# Patient Record
Sex: Female | Born: 1945 | Race: White | Hispanic: No | Smoking: Current every day smoker
Health system: Southern US, Community
[De-identification: ages and names within clinical notes are randomized; demographics above are authoritative.]

---

## 2007-11-10 ENCOUNTER — Emergency Department (HOSPITAL_COMMUNITY): Admission: EM | Admit: 2007-11-10 | Discharge: 2007-11-11 | Payer: Self-pay | Admitting: Emergency Medicine

## 2012-12-27 ENCOUNTER — Ambulatory Visit: Payer: Self-pay | Admitting: Family Medicine

## 2013-05-08 ENCOUNTER — Ambulatory Visit: Payer: Self-pay | Admitting: Unknown Physician Specialty

## 2013-06-20 ENCOUNTER — Ambulatory Visit: Payer: Self-pay | Admitting: Unknown Physician Specialty

## 2013-07-17 ENCOUNTER — Ambulatory Visit: Payer: Self-pay | Admitting: Internal Medicine

## 2013-07-27 ENCOUNTER — Ambulatory Visit: Payer: Self-pay | Admitting: Internal Medicine

## 2013-07-28 LAB — FOLATE: Folic Acid: 26.2 ng/mL (ref 3.1–100.0)

## 2013-07-28 LAB — CBC CANCER CENTER
Basophil #: 0.1 x10 3/mm (ref 0.0–0.1)
Basophil %: 0.4 %
HCT: 34.8 % — ABNORMAL LOW (ref 35.0–47.0)
HGB: 11.9 g/dL — ABNORMAL LOW (ref 12.0–16.0)
Lymphocyte %: 32.8 %
MCV: 87 fL (ref 80–100)
Monocyte #: 0.7 x10 3/mm (ref 0.2–0.9)
Neutrophil #: 8.8 x10 3/mm — ABNORMAL HIGH (ref 1.4–6.5)
Neutrophil %: 61.4 %
Platelet: 521 x10 3/mm — ABNORMAL HIGH (ref 150–440)

## 2013-07-28 LAB — IRON AND TIBC
Iron Saturation: 21 %
Iron: 91 ug/dL (ref 50–170)

## 2013-07-28 LAB — RETICULOCYTES: Reticulocyte: 1.77 %

## 2013-07-30 IMAGING — CT CT ABDOMEN W/O CM
1 of 2 series · 15 of 32 positions shown, 19 images · non-contrast
Comparison: None

REASON FOR EXAM: Abdominal US showed an absent spleen Please confirm
COMMENTS:

PROCEDURE:     KCT - KCT ABDOMEN STANDARD WO  - June 20, 2013  [DATE]
RESULT:     History: Abdominal ultrasound
TECHNIQUE: Multiple axial images of the abdomen were performed from the lung
bases to the iliac crests, without p.o. contrast and without intravenous
contrast.

[Series 2: abd 3mm wo 3.0 i40f 3 · axial · 0.70mm/px · z∈[-721,-511]mm · 15 of 78 slices shown, 19 images]
[im 4/78  soft-tissue]
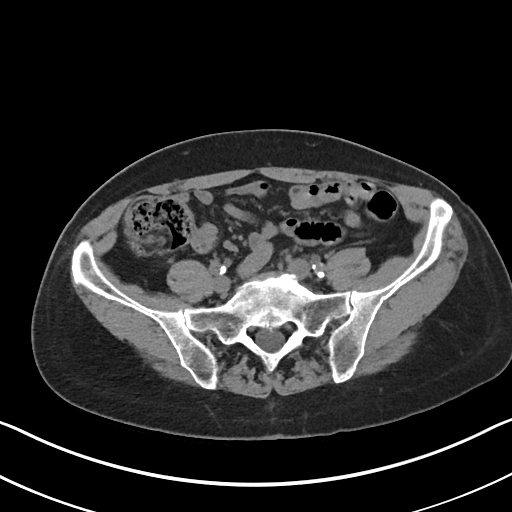
[im 4/78  bone]
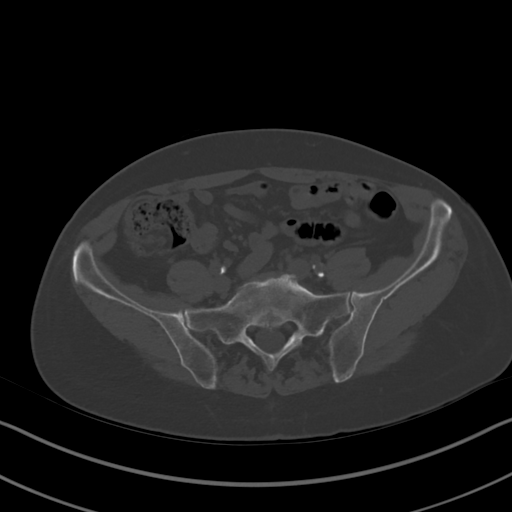
[im 11/78  soft-tissue]
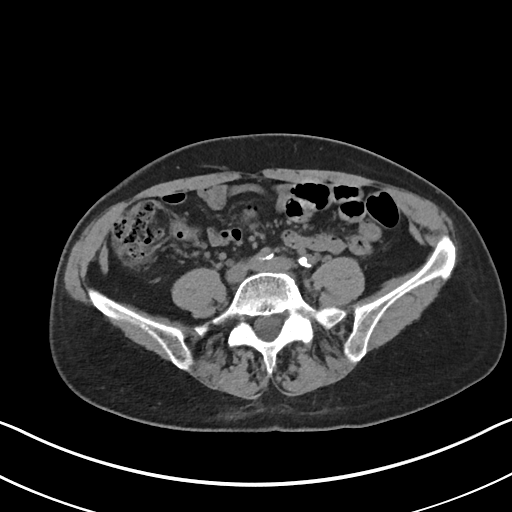
[im 17/78  soft-tissue]
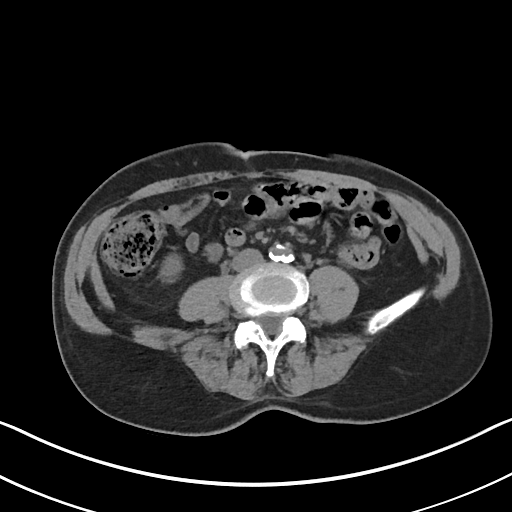
[im 21/78  soft-tissue]
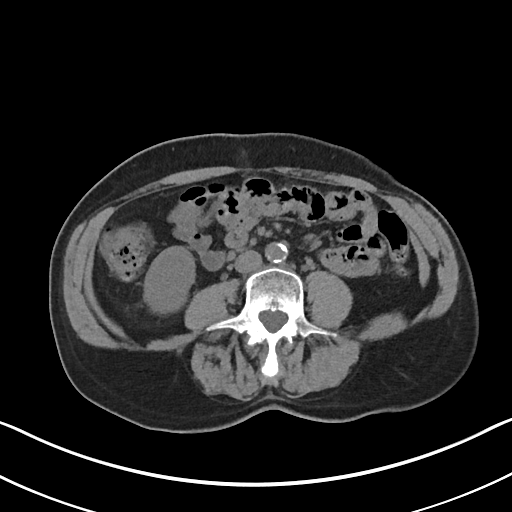
[im 27/78  soft-tissue]
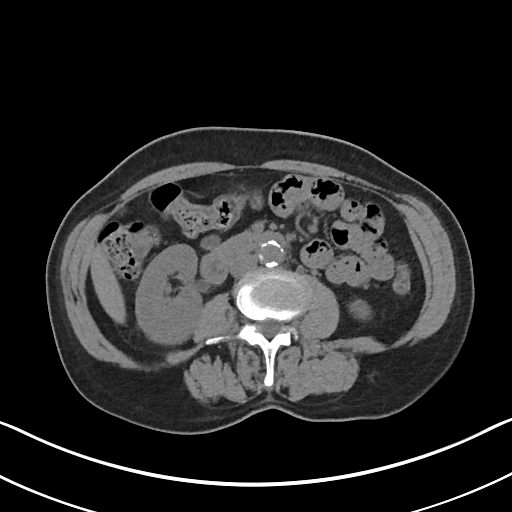
[im 34/78  soft-tissue]
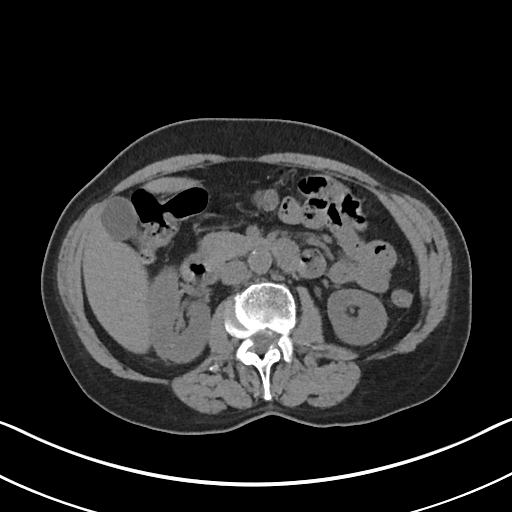
[im 41/78  soft-tissue]
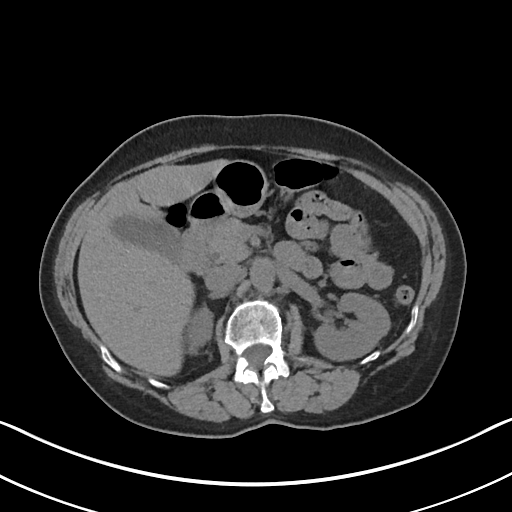
[im 44/78  soft-tissue]
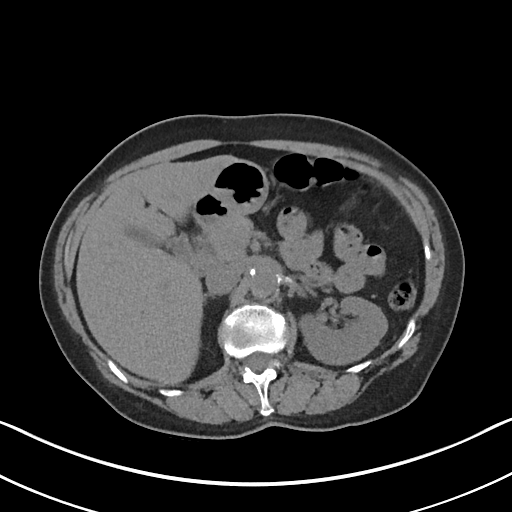
[im 51/78  soft-tissue]
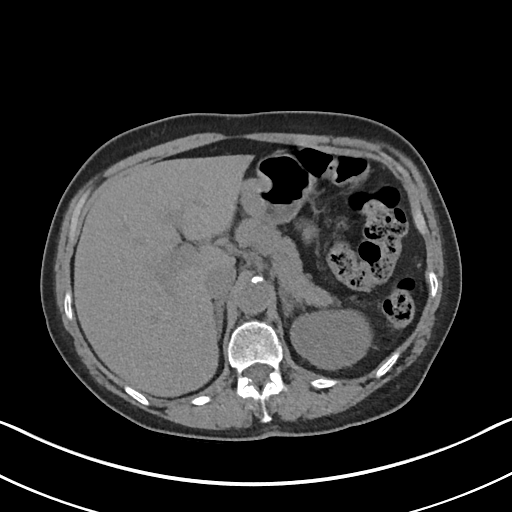
[im 51/78  bone]
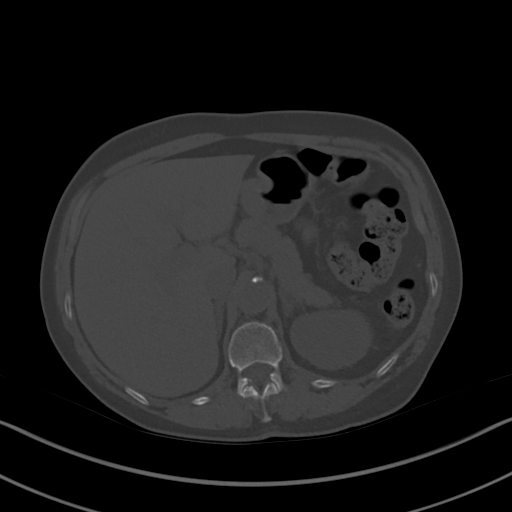
[im 57/78  soft-tissue]
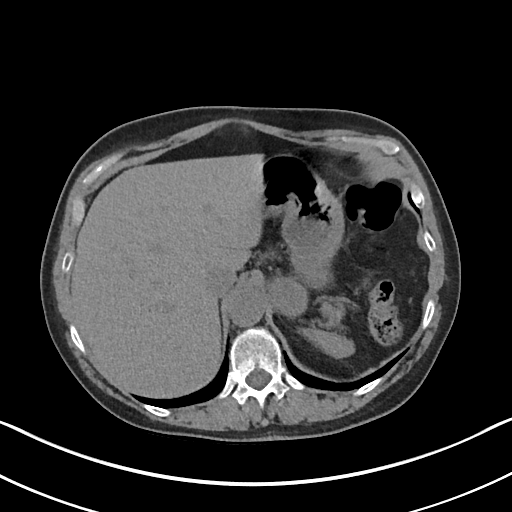
[im 61/78  soft-tissue]
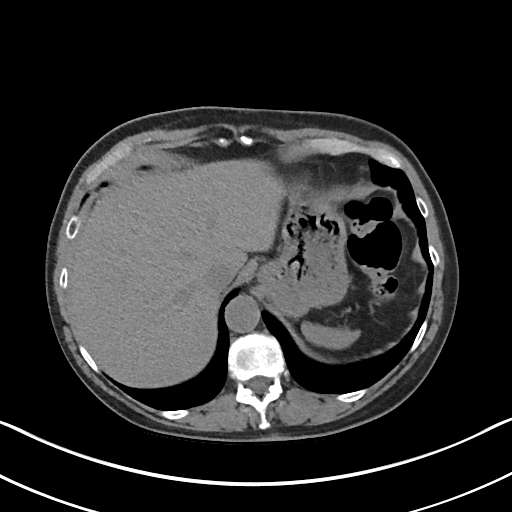
[im 64/78  lung]
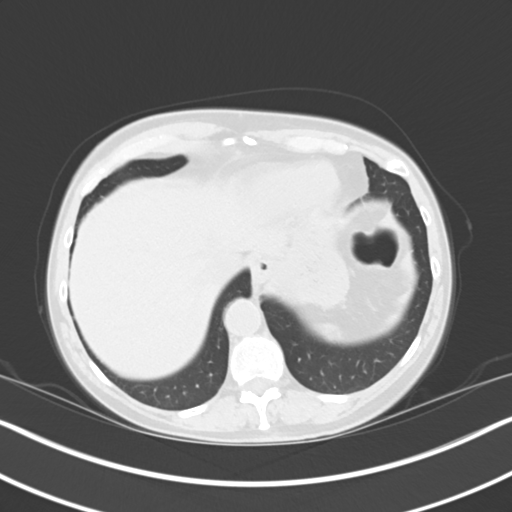
[im 67/78  soft-tissue]
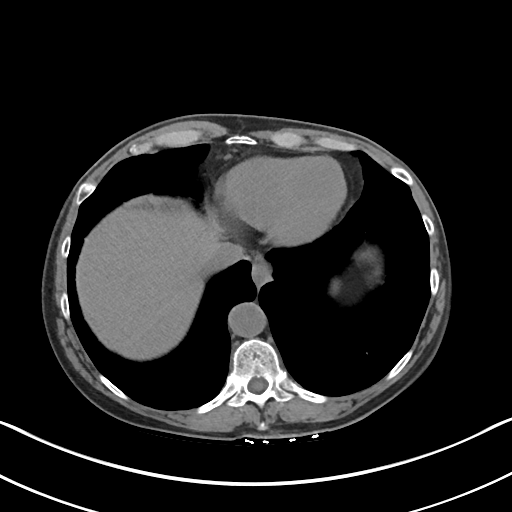
[im 67/78  lung]
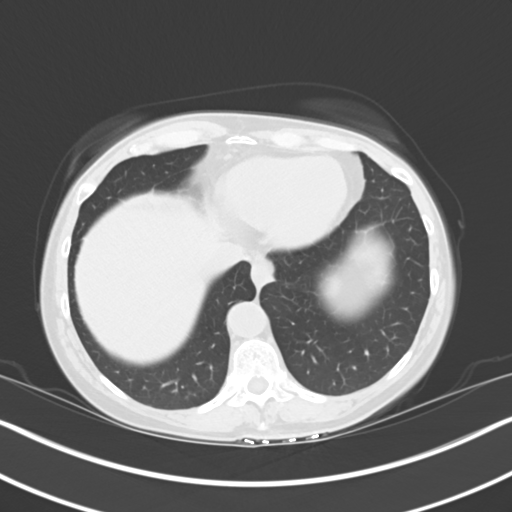
[im 71/78  lung]
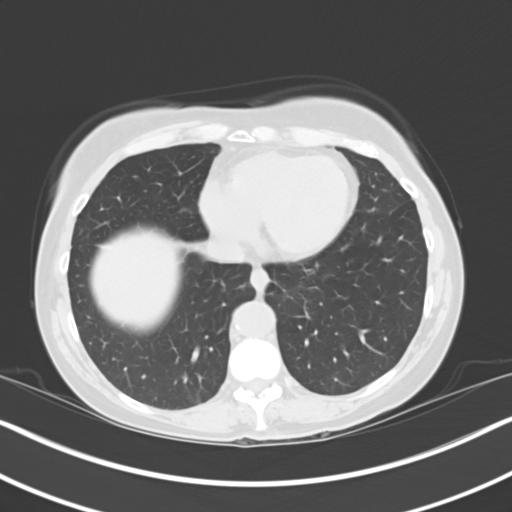
[im 74/78  soft-tissue]
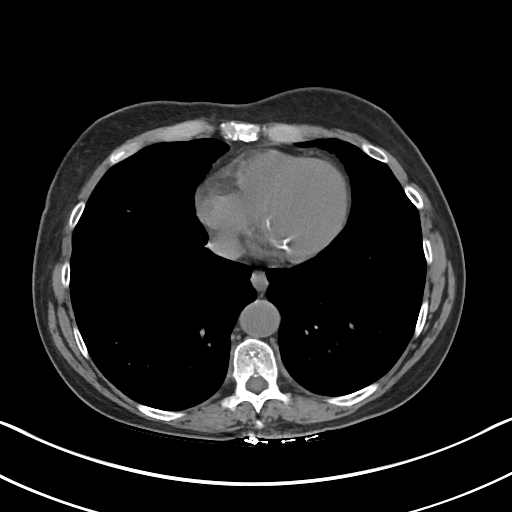
[im 74/78  lung]
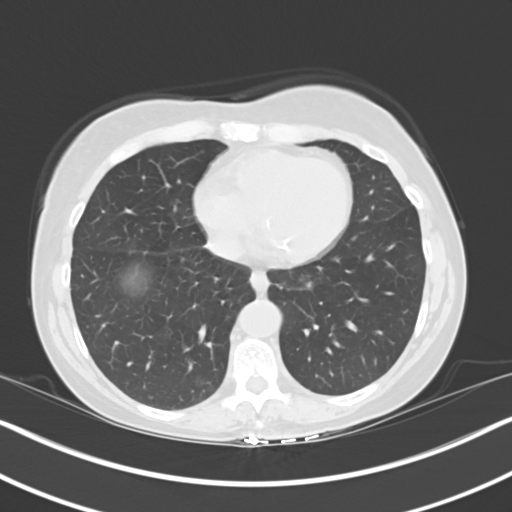

[15 of 32 positions shown; findings below may reference images not displayed]

FINDINGS: The lung bases are clear. There is no pneumothorax. The heart size is
normal.

The liver demonstrates no focal abnormality. There is no intrahepatic or
extrahepatic biliary ductal dilatation. The gallbladder is unremarkable. The
spleen is small in size without focal abnormality. The spleen measures 4 cm
in length.. The kidneys, adrenal glands, and pancreas are normal.

The visualized portions of the stomach, duodenum, small intestine, and large
intestine demonstrate no contrast extravasation or dilatation. There is no
pneumoperitoneum, pneumatosis, or portal venous gas. There is no abdominal
free fluid. There is no lymphadenopathy.

The abdominal aorta is normal in caliber with atherosclerosis.

The osseous structures are unremarkable.
IMPRESSION: 1. Spleen is small in size without focal abnormality.

[REDACTED]

## 2013-08-07 ENCOUNTER — Ambulatory Visit: Payer: Self-pay | Admitting: Internal Medicine

## 2013-09-06 ENCOUNTER — Ambulatory Visit: Payer: Self-pay | Admitting: Internal Medicine

## 2013-10-30 ENCOUNTER — Ambulatory Visit: Payer: Self-pay | Admitting: Unknown Physician Specialty

## 2013-11-03 LAB — PATHOLOGY REPORT

## 2013-12-11 ENCOUNTER — Ambulatory Visit: Payer: Self-pay | Admitting: Internal Medicine

## 2013-12-11 LAB — CBC CANCER CENTER
BASOS ABS: 0.1 x10 3/mm (ref 0.0–0.1)
BASOS PCT: 0.5 %
EOS ABS: 0 x10 3/mm (ref 0.0–0.7)
EOS PCT: 0.2 %
HCT: 39.8 % (ref 35.0–47.0)
HGB: 13 g/dL (ref 12.0–16.0)
Lymphocyte #: 5.7 x10 3/mm — ABNORMAL HIGH (ref 1.0–3.6)
Lymphocyte %: 28.1 %
MCH: 30 pg (ref 26.0–34.0)
MCHC: 32.7 g/dL (ref 32.0–36.0)
MCV: 92 fL (ref 80–100)
MONO ABS: 0.7 x10 3/mm (ref 0.2–0.9)
Monocyte %: 3.6 %
NEUTROS ABS: 13.6 x10 3/mm — AB (ref 1.4–6.5)
Neutrophil %: 67.6 %
Platelet: 463 x10 3/mm — ABNORMAL HIGH (ref 150–440)
RBC: 4.33 10*6/uL (ref 3.80–5.20)
RDW: 14.8 % — AB (ref 11.5–14.5)
WBC: 20.1 x10 3/mm — AB (ref 3.6–11.0)

## 2013-12-11 LAB — IRON AND TIBC
IRON BIND. CAP.(TOTAL): 339 ug/dL (ref 250–450)
IRON: 107 ug/dL (ref 50–170)
Iron Saturation: 32 %
Unbound Iron-Bind.Cap.: 232 ug/dL

## 2013-12-11 LAB — FERRITIN: Ferritin (ARMC): 48 ng/mL (ref 8–388)

## 2014-01-07 ENCOUNTER — Ambulatory Visit: Payer: Self-pay | Admitting: Internal Medicine

## 2014-03-12 DIAGNOSIS — M1990 Unspecified osteoarthritis, unspecified site: Secondary | ICD-10-CM | POA: Diagnosis present

## 2014-04-10 ENCOUNTER — Ambulatory Visit: Payer: Self-pay | Admitting: Internal Medicine

## 2014-04-10 LAB — CBC CANCER CENTER
BASOS PCT: 0.7 %
Basophil #: 0.1 x10 3/mm (ref 0.0–0.1)
EOS ABS: 0.2 x10 3/mm (ref 0.0–0.7)
Eosinophil %: 1 %
HCT: 38.3 % (ref 35.0–47.0)
HGB: 12.8 g/dL (ref 12.0–16.0)
Lymphocyte #: 4.9 x10 3/mm — ABNORMAL HIGH (ref 1.0–3.6)
Lymphocyte %: 32.3 %
MCH: 30.6 pg (ref 26.0–34.0)
MCHC: 33.5 g/dL (ref 32.0–36.0)
MCV: 91 fL (ref 80–100)
MONOS PCT: 3.8 %
Monocyte #: 0.6 x10 3/mm (ref 0.2–0.9)
NEUTROS ABS: 9.5 x10 3/mm — AB (ref 1.4–6.5)
Neutrophil %: 62.2 %
Platelet: 495 x10 3/mm — ABNORMAL HIGH (ref 150–440)
RBC: 4.2 10*6/uL (ref 3.80–5.20)
RDW: 14.5 % (ref 11.5–14.5)
WBC: 15.2 x10 3/mm — ABNORMAL HIGH (ref 3.6–11.0)

## 2014-04-10 LAB — IRON AND TIBC
IRON BIND. CAP.(TOTAL): 325 ug/dL (ref 250–450)
IRON: 81 ug/dL (ref 50–170)
Iron Saturation: 25 %
UNBOUND IRON-BIND. CAP.: 244 ug/dL

## 2014-04-10 LAB — FERRITIN: Ferritin (ARMC): 62 ng/mL (ref 8–388)

## 2014-05-07 ENCOUNTER — Ambulatory Visit: Payer: Self-pay | Admitting: Internal Medicine

## 2014-05-22 ENCOUNTER — Ambulatory Visit: Payer: Self-pay | Admitting: Family Medicine

## 2014-07-01 IMAGING — MG MM DIGITAL SCREENING BILAT W/ CAD
1 series · 4 of 4 positions shown · non-contrast
Comparison: Previous exam(s).

CLINICAL DATA: Screening.

EXAM:
DIGITAL SCREENING BILATERAL MAMMOGRAM WITH CAD

[R CC · right · 4 of 4 slices shown]
[im 1/4]
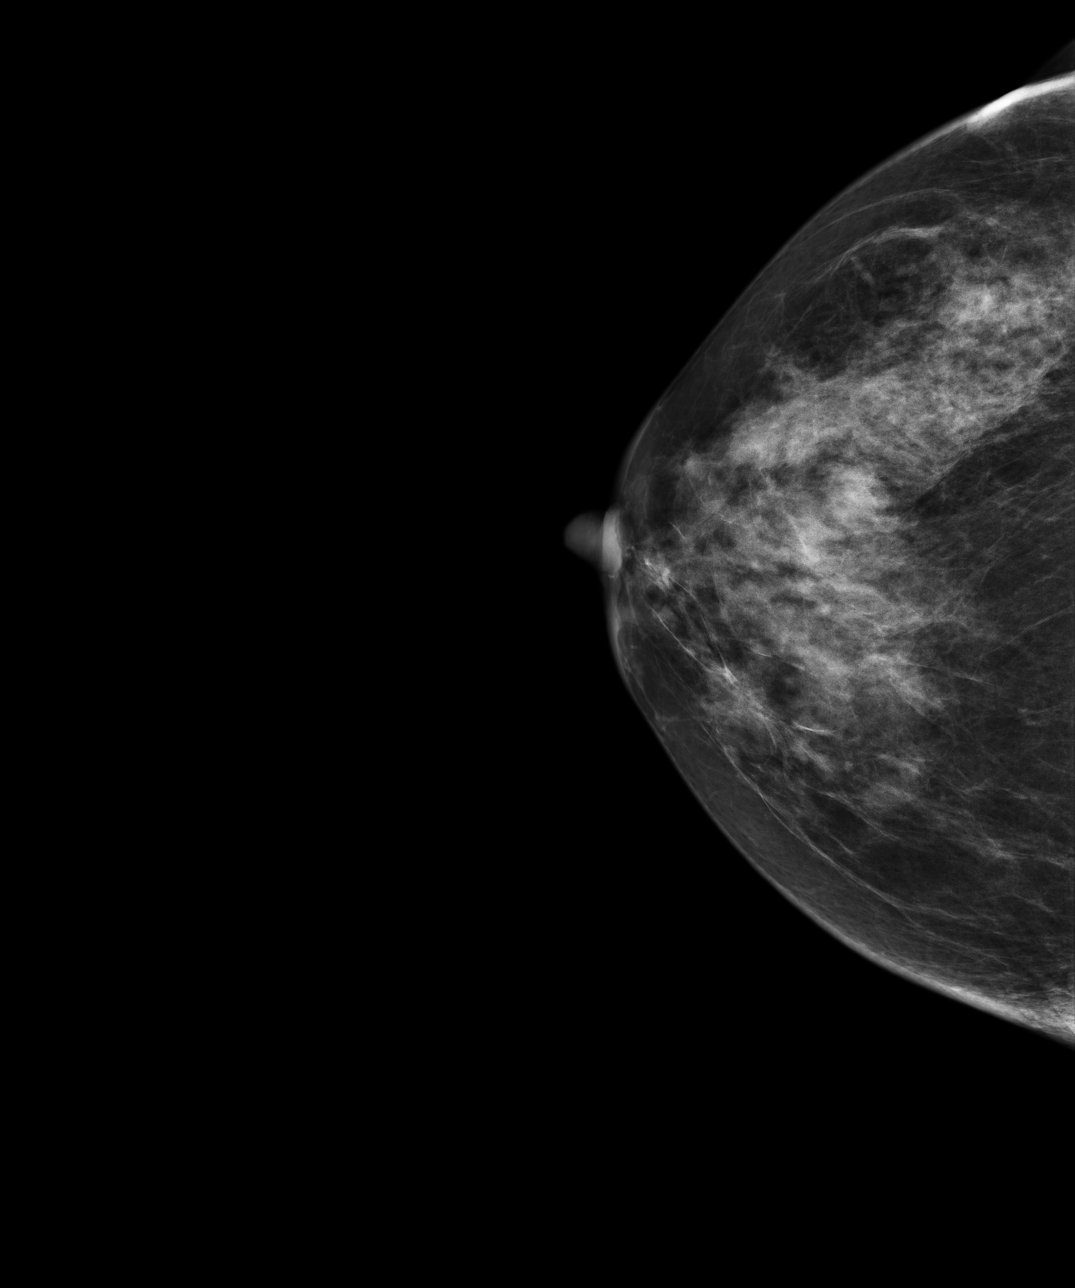
[im 2/4]
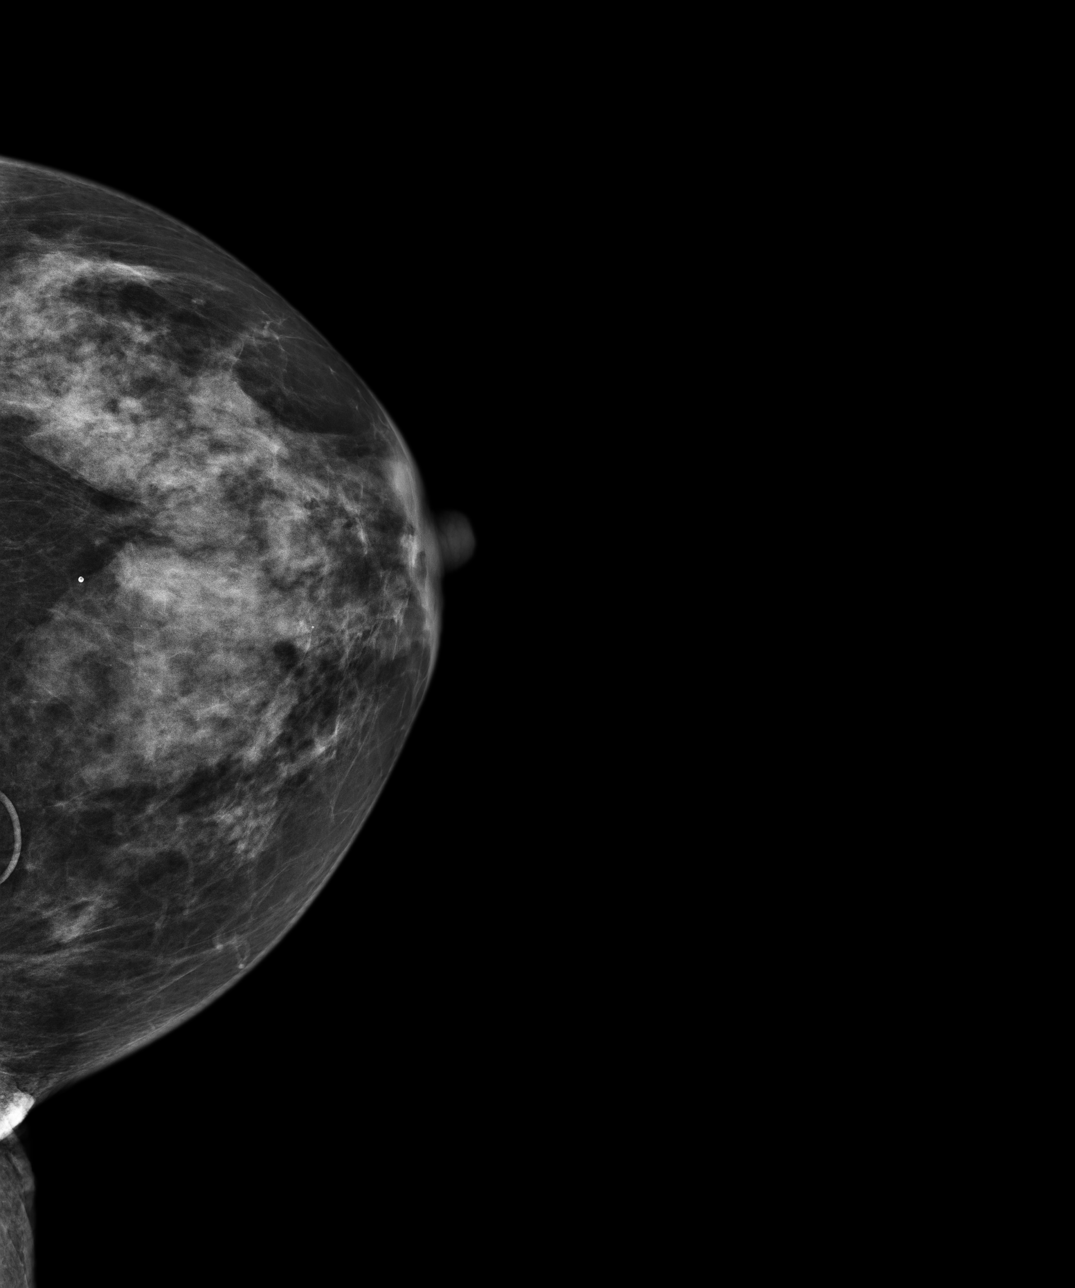
[im 3/4]
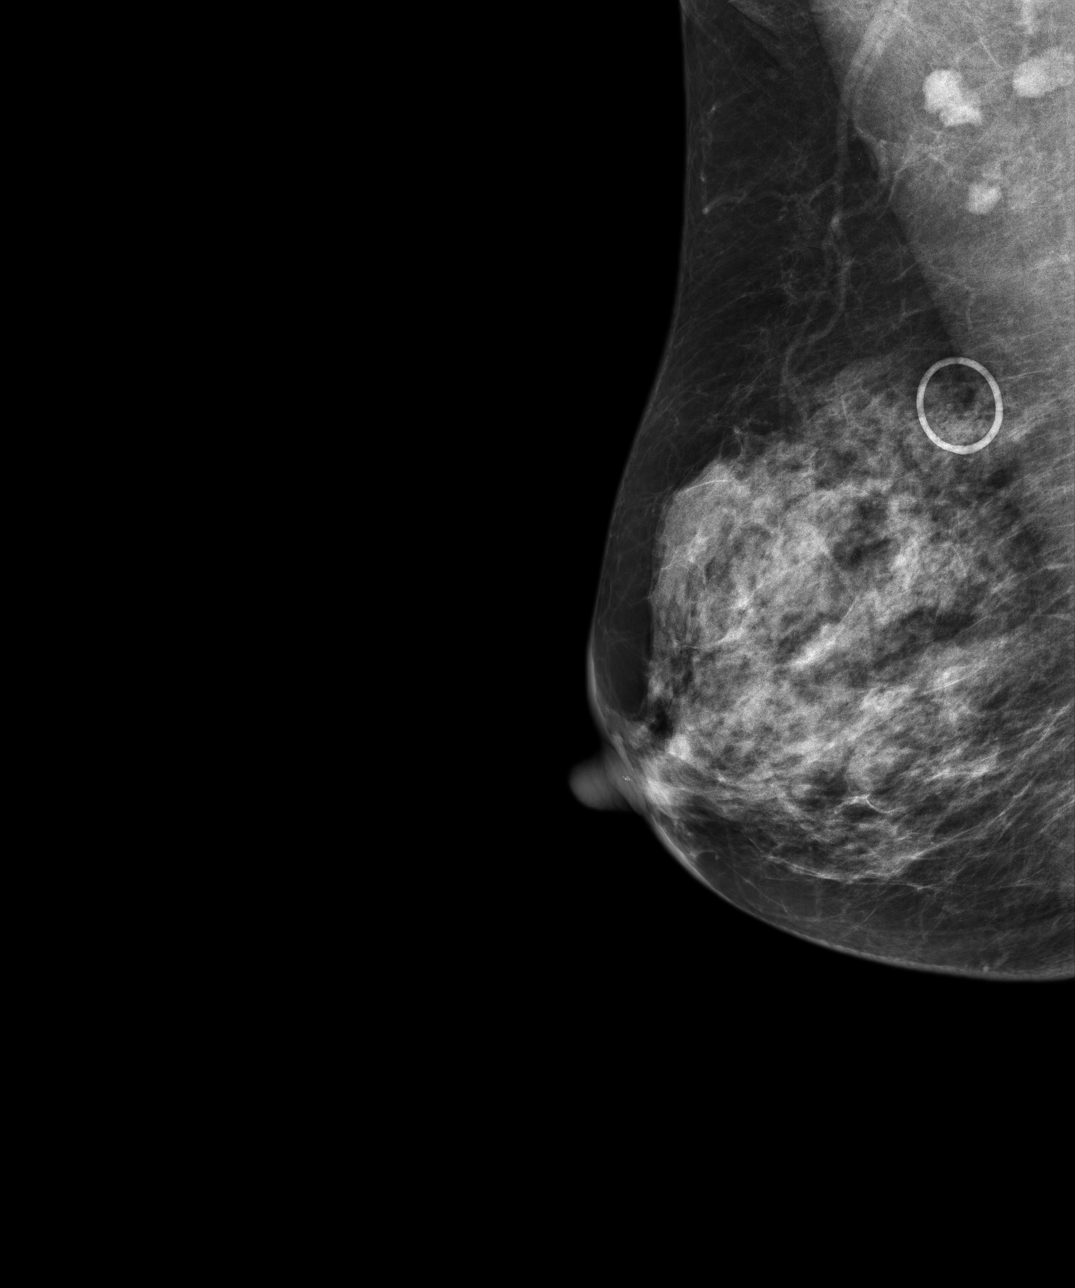
[im 4/4]
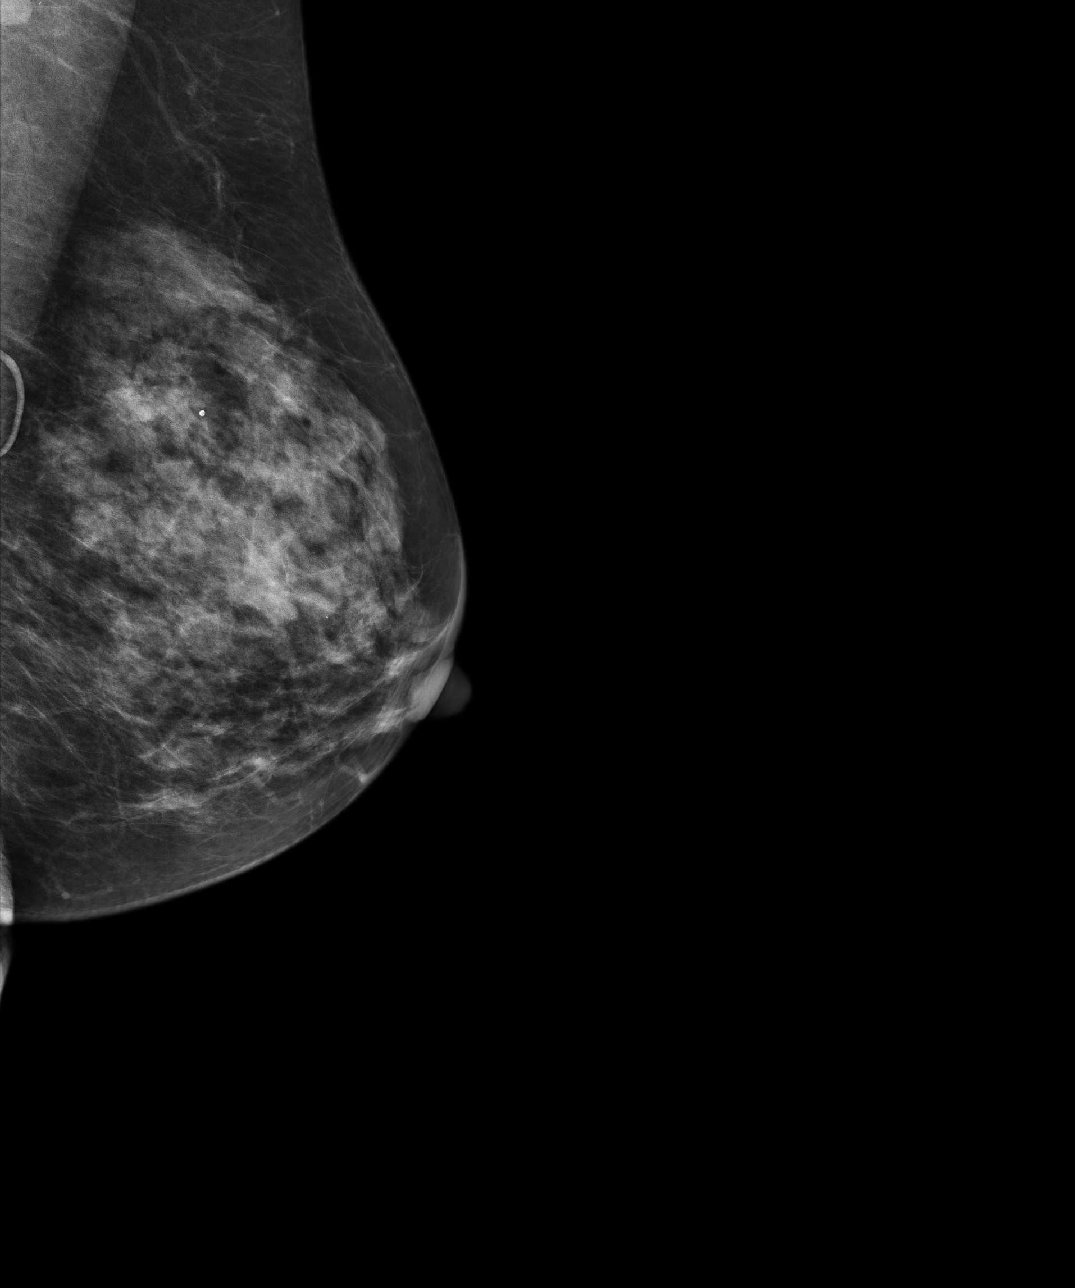

[4 of 4 positions shown; findings below may reference images not displayed]

ACR Breast Density Category d: The breast tissue is extremely dense,
which lowers the sensitivity of mammography.
FINDINGS: There are no findings suspicious for malignancy. Images were
processed with CAD.
IMPRESSION: No mammographic evidence of malignancy. A result letter of this
screening mammogram will be mailed directly to the patient.

RECOMMENDATION:
Screening mammogram in one year. (Code:BD-D-K0F)

BI-RADS CATEGORY  1: Negative.

## 2014-08-14 ENCOUNTER — Ambulatory Visit: Payer: Self-pay | Admitting: Internal Medicine

## 2014-08-14 LAB — CBC CANCER CENTER
Basophil #: 0.1 x10 3/mm (ref 0.0–0.1)
Basophil %: 0.4 %
EOS PCT: 1.1 %
Eosinophil #: 0.2 x10 3/mm (ref 0.0–0.7)
HCT: 38.5 % (ref 35.0–47.0)
HGB: 12.8 g/dL (ref 12.0–16.0)
LYMPHS PCT: 31.7 %
Lymphocyte #: 4.8 x10 3/mm — ABNORMAL HIGH (ref 1.0–3.6)
MCH: 30.6 pg (ref 26.0–34.0)
MCHC: 33.3 g/dL (ref 32.0–36.0)
MCV: 92 fL (ref 80–100)
MONOS PCT: 4.3 %
Monocyte #: 0.6 x10 3/mm (ref 0.2–0.9)
Neutrophil #: 9.4 x10 3/mm — ABNORMAL HIGH (ref 1.4–6.5)
Neutrophil %: 62.5 %
PLATELETS: 435 x10 3/mm (ref 150–440)
RBC: 4.18 10*6/uL (ref 3.80–5.20)
RDW: 14.3 % (ref 11.5–14.5)
WBC: 15.1 x10 3/mm — ABNORMAL HIGH (ref 3.6–11.0)

## 2014-08-14 LAB — IRON AND TIBC
IRON BIND. CAP.(TOTAL): 342 ug/dL (ref 250–450)
IRON SATURATION: 22 %
Iron: 75 ug/dL (ref 50–170)
Unbound Iron-Bind.Cap.: 267 ug/dL

## 2014-08-14 LAB — FERRITIN: Ferritin (ARMC): 37 ng/mL (ref 8–388)

## 2014-09-06 ENCOUNTER — Ambulatory Visit: Payer: Self-pay | Admitting: Internal Medicine

## 2014-11-12 ENCOUNTER — Ambulatory Visit: Payer: Self-pay | Admitting: Unknown Physician Specialty

## 2015-05-10 DIAGNOSIS — M810 Age-related osteoporosis without current pathological fracture: Secondary | ICD-10-CM | POA: Diagnosis present

## 2016-05-14 ENCOUNTER — Other Ambulatory Visit: Payer: Self-pay | Admitting: Family Medicine

## 2016-05-14 DIAGNOSIS — Z1239 Encounter for other screening for malignant neoplasm of breast: Secondary | ICD-10-CM

## 2016-06-02 ENCOUNTER — Other Ambulatory Visit: Payer: Self-pay | Admitting: Family Medicine

## 2016-06-02 ENCOUNTER — Ambulatory Visit
Admission: RE | Admit: 2016-06-02 | Discharge: 2016-06-02 | Disposition: A | Discharge status: Home / Self Care | Payer: Medicare Other | Source: Ambulatory Visit | Attending: Family Medicine | Admitting: Family Medicine

## 2016-06-02 DIAGNOSIS — Z1239 Encounter for other screening for malignant neoplasm of breast: Secondary | ICD-10-CM

## 2016-06-02 DIAGNOSIS — Z1231 Encounter for screening mammogram for malignant neoplasm of breast: Secondary | ICD-10-CM

## 2017-05-18 ENCOUNTER — Other Ambulatory Visit: Payer: Self-pay | Admitting: Family Medicine

## 2017-05-18 DIAGNOSIS — Z1239 Encounter for other screening for malignant neoplasm of breast: Secondary | ICD-10-CM

## 2017-06-07 ENCOUNTER — Ambulatory Visit
Admission: RE | Admit: 2017-06-07 | Discharge: 2017-06-07 | Disposition: A | Discharge status: Home / Self Care | Payer: Medicare Other | Source: Ambulatory Visit | Attending: Family Medicine | Admitting: Family Medicine

## 2017-06-07 DIAGNOSIS — Z1231 Encounter for screening mammogram for malignant neoplasm of breast: Secondary | ICD-10-CM | POA: Diagnosis not present

## 2017-06-07 DIAGNOSIS — Z1239 Encounter for other screening for malignant neoplasm of breast: Secondary | ICD-10-CM

## 2018-05-18 DIAGNOSIS — E785 Hyperlipidemia, unspecified: Secondary | ICD-10-CM | POA: Diagnosis present

## 2019-09-11 ENCOUNTER — Telehealth: Payer: Self-pay | Admitting: *Deleted

## 2019-09-11 NOTE — Telephone Encounter (Signed)
Received referral for low dose lung cancer screening CT scan. Message left at phone number listed in EMR for patient to call me back to facilitate scheduling scan.  

## 2019-09-12 ENCOUNTER — Telehealth: Payer: Self-pay | Admitting: *Deleted

## 2019-09-12 DIAGNOSIS — Z87891 Personal history of nicotine dependence: Secondary | ICD-10-CM

## 2019-09-12 DIAGNOSIS — Z122 Encounter for screening for malignant neoplasm of respiratory organs: Secondary | ICD-10-CM

## 2019-09-12 NOTE — Telephone Encounter (Signed)
Received referral for initial lung cancer screening scan. Contacted patient and obtained smoking history,(current, 32.25 pack year) as well as answering questions related to screening process. Patient denies signs of lung cancer such as weight loss or hemoptysis. Patient denies comorbidity that would prevent curative treatment if lung cancer were found. Patient is scheduled for shared decision making visit and CT scan on 09/28/19 at 115pm.

## 2019-09-27 ENCOUNTER — Encounter: Payer: Self-pay | Admitting: Nurse Practitioner

## 2019-09-28 ENCOUNTER — Inpatient Hospital Stay: Payer: Medicare Other | Attending: Nurse Practitioner | Admitting: Nurse Practitioner

## 2019-09-28 ENCOUNTER — Other Ambulatory Visit: Payer: Self-pay

## 2019-09-28 ENCOUNTER — Ambulatory Visit
Admission: RE | Admit: 2019-09-28 | Discharge: 2019-09-28 | Disposition: A | Discharge status: Home / Self Care | Payer: Medicare Other | Source: Ambulatory Visit | Attending: Nurse Practitioner | Admitting: Nurse Practitioner

## 2019-09-28 DIAGNOSIS — Z122 Encounter for screening for malignant neoplasm of respiratory organs: Secondary | ICD-10-CM

## 2019-09-28 DIAGNOSIS — Z87891 Personal history of nicotine dependence: Secondary | ICD-10-CM | POA: Diagnosis present

## 2019-09-28 DIAGNOSIS — F17210 Nicotine dependence, cigarettes, uncomplicated: Secondary | ICD-10-CM | POA: Diagnosis not present

## 2019-09-28 NOTE — Progress Notes (Signed)
Virtual Visit via Video Enabled Telemedicine Note   I connected with Marilyn Nicholson on 09/28/19 at 1:15 PM EST by video enabled telemedicine visit and verified that I am speaking with the correct person using two identifiers.   I discussed the limitations, risks, security and privacy concerns of performing an evaluation and management service by telemedicine and the availability of in-person appointments. I also discussed with the patient that there may be a patient responsible charge related to this service. The patient expressed understanding and agreed to proceed.   Other persons participating in the visit and their role in the encounter: Burgess Estelle, RN- checking in patient & navigation  Patient's location: [place]  Provider's location: Clinic  Chief Complaint: Low Dose CT Screening  Patient agreed to evaluation by telemedicine to discuss shared decision making for consideration of low dose CT lung cancer screening.    In accordance with CMS guidelines, patient has met eligibility criteria including age, absence of signs or symptoms of lung cancer.  Social History   Tobacco Use  . Smoking status: Current Every Day Smoker    Packs/day: 0.75    Years: 43.00    Pack years: 32.25    Types: Cigarettes  Substance Use Topics  . Alcohol use: Not on file     A shared decision-making session was conducted prior to the performance of CT scan. This includes one or more decision aids, includes benefits and harms of screening, follow-up diagnostic testing, over-diagnosis, false positive rate, and total radiation exposure.   Counseling on the importance of adherence to annual lung cancer LDCT screening, impact of co-morbidities, and ability or willingness to undergo diagnosis and treatment is imperative for compliance of the program.   Counseling on the importance of continued smoking cessation for former smokers; the importance of smoking cessation for current smokers, and information  about tobacco cessation interventions have been given to patient including [place] and [employer] [place] programs.   Written order for lung cancer screening with LDCT has been given to the patient and any and all questions have been answered to the best of my abilities.    Yearly follow up will be coordinated by Burgess Estelle, Thoracic Navigator.  I discussed the assessment and treatment plan with the patient. The patient was provided an opportunity to ask questions and all were answered. The patient agreed with the plan and demonstrated an understanding of the instructions.   The patient was advised to call back or seek an in-person evaluation if the symptoms worsen or if the condition fails to improve as anticipated.   I provided 15 minutes of face-to-face video visit time during this encounter, and > 50% was spent counseling as documented under my assessment & plan.   Beckey Rutter, DNP, AGNP-C [place] at [facility] [phone] (work cell) [phone] (office)

## 2019-10-05 ENCOUNTER — Encounter: Payer: Self-pay | Admitting: *Deleted

## 2019-10-31 ENCOUNTER — Other Ambulatory Visit: Payer: Self-pay | Admitting: Family Medicine

## 2020-04-11 ENCOUNTER — Other Ambulatory Visit: Payer: Self-pay | Admitting: Family Medicine

## 2020-04-11 DIAGNOSIS — Z1231 Encounter for screening mammogram for malignant neoplasm of breast: Secondary | ICD-10-CM

## 2020-04-16 ENCOUNTER — Ambulatory Visit
Admission: RE | Admit: 2020-04-16 | Discharge: 2020-04-16 | Disposition: A | Discharge status: Home / Self Care | Payer: Medicare Other | Source: Ambulatory Visit | Attending: Family Medicine | Admitting: Family Medicine

## 2020-04-16 DIAGNOSIS — Z1231 Encounter for screening mammogram for malignant neoplasm of breast: Secondary | ICD-10-CM | POA: Insufficient documentation

## 2020-09-27 ENCOUNTER — Telehealth: Payer: Self-pay

## 2020-09-27 NOTE — Telephone Encounter (Signed)
Attempted to contact patient to schedule lung screening scan but unable to reach her. Left message to return call back to schedule CT scan.

## 2020-10-10 ENCOUNTER — Other Ambulatory Visit: Payer: Self-pay | Admitting: *Deleted

## 2020-10-10 DIAGNOSIS — Z87891 Personal history of nicotine dependence: Secondary | ICD-10-CM

## 2020-10-10 DIAGNOSIS — Z122 Encounter for screening for malignant neoplasm of respiratory organs: Secondary | ICD-10-CM

## 2020-10-10 NOTE — Progress Notes (Signed)
Contacted and scheduled for lung screening scan. Current smoker, 33 pack year

## 2020-10-15 ENCOUNTER — Ambulatory Visit
Admission: RE | Admit: 2020-10-15 | Discharge: 2020-10-15 | Disposition: A | Discharge status: Home / Self Care | Payer: Medicare Other | Source: Ambulatory Visit | Attending: Nurse Practitioner | Admitting: Nurse Practitioner

## 2020-10-15 ENCOUNTER — Other Ambulatory Visit: Payer: Self-pay

## 2020-10-15 DIAGNOSIS — Z122 Encounter for screening for malignant neoplasm of respiratory organs: Secondary | ICD-10-CM | POA: Diagnosis present

## 2020-10-15 DIAGNOSIS — Z87891 Personal history of nicotine dependence: Secondary | ICD-10-CM | POA: Diagnosis present

## 2020-10-17 ENCOUNTER — Encounter: Payer: Self-pay | Admitting: *Deleted

## 2021-11-30 ENCOUNTER — Other Ambulatory Visit: Payer: Self-pay

## 2021-11-30 ENCOUNTER — Emergency Department (HOSPITAL_COMMUNITY): Payer: Medicare Other

## 2021-11-30 ENCOUNTER — Inpatient Hospital Stay (HOSPITAL_COMMUNITY)
Admission: EM | Admit: 2021-11-30 | Discharge: 2021-12-05 | DRG: 481 | Disposition: A | Discharge status: Home Health | Payer: Medicare Other | Attending: Family Medicine | Admitting: Family Medicine

## 2021-11-30 ENCOUNTER — Encounter (HOSPITAL_COMMUNITY): Payer: Self-pay | Admitting: Emergency Medicine

## 2021-11-30 DIAGNOSIS — Z96649 Presence of unspecified artificial hip joint: Secondary | ICD-10-CM

## 2021-11-30 DIAGNOSIS — D62 Acute posthemorrhagic anemia: Secondary | ICD-10-CM | POA: Diagnosis not present

## 2021-11-30 DIAGNOSIS — W010XXA Fall on same level from slipping, tripping and stumbling without subsequent striking against object, initial encounter: Secondary | ICD-10-CM | POA: Diagnosis present

## 2021-11-30 DIAGNOSIS — R110 Nausea: Secondary | ICD-10-CM | POA: Diagnosis not present

## 2021-11-30 DIAGNOSIS — M1990 Unspecified osteoarthritis, unspecified site: Secondary | ICD-10-CM | POA: Diagnosis present

## 2021-11-30 DIAGNOSIS — I10 Essential (primary) hypertension: Secondary | ICD-10-CM | POA: Diagnosis present

## 2021-11-30 DIAGNOSIS — Z419 Encounter for procedure for purposes other than remedying health state, unspecified: Secondary | ICD-10-CM | POA: Diagnosis present

## 2021-11-30 DIAGNOSIS — S51012A Laceration without foreign body of left elbow, initial encounter: Secondary | ICD-10-CM | POA: Diagnosis present

## 2021-11-30 DIAGNOSIS — E871 Hypo-osmolality and hyponatremia: Secondary | ICD-10-CM | POA: Diagnosis present

## 2021-11-30 DIAGNOSIS — K08409 Partial loss of teeth, unspecified cause, unspecified class: Secondary | ICD-10-CM | POA: Diagnosis present

## 2021-11-30 DIAGNOSIS — Z20822 Contact with and (suspected) exposure to covid-19: Secondary | ICD-10-CM | POA: Diagnosis present

## 2021-11-30 DIAGNOSIS — D72829 Elevated white blood cell count, unspecified: Secondary | ICD-10-CM | POA: Diagnosis present

## 2021-11-30 DIAGNOSIS — Z79899 Other long term (current) drug therapy: Secondary | ICD-10-CM

## 2021-11-30 DIAGNOSIS — D649 Anemia, unspecified: Secondary | ICD-10-CM | POA: Diagnosis present

## 2021-11-30 DIAGNOSIS — Z888 Allergy status to other drugs, medicaments and biological substances status: Secondary | ICD-10-CM | POA: Diagnosis not present

## 2021-11-30 DIAGNOSIS — R54 Age-related physical debility: Secondary | ICD-10-CM | POA: Diagnosis present

## 2021-11-30 DIAGNOSIS — M810 Age-related osteoporosis without current pathological fracture: Secondary | ICD-10-CM | POA: Diagnosis present

## 2021-11-30 DIAGNOSIS — E785 Hyperlipidemia, unspecified: Secondary | ICD-10-CM | POA: Diagnosis present

## 2021-11-30 DIAGNOSIS — F17210 Nicotine dependence, cigarettes, uncomplicated: Secondary | ICD-10-CM | POA: Diagnosis present

## 2021-11-30 DIAGNOSIS — S72142A Displaced intertrochanteric fracture of left femur, initial encounter for closed fracture: Principal | ICD-10-CM | POA: Diagnosis present

## 2021-11-30 LAB — BASIC METABOLIC PANEL
Anion gap: 10 (ref 5–15)
BUN: 18 mg/dL (ref 8–23)
CO2: 18 mmol/L — ABNORMAL LOW (ref 22–32)
Calcium: 8.9 mg/dL (ref 8.9–10.3)
Chloride: 105 mmol/L (ref 98–111)
Creatinine, Ser: 0.86 mg/dL (ref 0.44–1.00)
GFR, Estimated: >60 mL/min (ref >60)
Glucose, Bld: 91 mg/dL (ref 70–99)
Potassium: 3.7 mmol/L (ref 3.5–5.1)
Sodium: 133 mmol/L — ABNORMAL LOW (ref 135–145)

## 2021-11-30 LAB — CBC WITH DIFFERENTIAL/PLATELET
Abs Immature Granulocytes: 0.09 10*3/uL — ABNORMAL HIGH (ref 0.00–0.07)
Basophils Absolute: 0 10*3/uL (ref 0.0–0.1)
Basophils Relative: 0 %
Eosinophils Absolute: 0 10*3/uL (ref 0.0–0.5)
Eosinophils Relative: 0 %
HCT: 29.3 % — ABNORMAL LOW (ref 36.0–46.0)
Hemoglobin: 9.5 g/dL — ABNORMAL LOW (ref 12.0–15.0)
Immature Granulocytes: 1 %
Lymphocytes Relative: 23 %
Lymphs Abs: 3.9 10*3/uL (ref 0.7–4.0)
MCH: 28.5 pg (ref 26.0–34.0)
MCHC: 32.4 g/dL (ref 30.0–36.0)
MCV: 88 fL (ref 80.0–100.0)
Monocytes Absolute: 1.1 10*3/uL — ABNORMAL HIGH (ref 0.1–1.0)
Monocytes Relative: 6 %
Neutro Abs: 11.7 10*3/uL — ABNORMAL HIGH (ref 1.7–7.7)
Neutrophils Relative %: 70 %
Platelets: 402 10*3/uL — ABNORMAL HIGH (ref 150–400)
RBC: 3.33 MIL/uL — ABNORMAL LOW (ref 3.87–5.11)
RDW: 15.7 % — ABNORMAL HIGH (ref 11.5–15.5)
WBC: 16.8 10*3/uL — ABNORMAL HIGH (ref 4.0–10.5)
nRBC: 0 % (ref 0.0–0.2)

## 2021-11-30 LAB — TYPE AND SCREEN
ABO/RH(D): A POS
Antibody Screen: NEGATIVE

## 2021-11-30 LAB — ABO/RH: ABO/RH(D): A POS

## 2021-11-30 MED ORDER — SODIUM CHLORIDE 0.45 % IV SOLN: INTRAVENOUS | Status: DC

## 2021-11-30 MED ORDER — MORPHINE SULFATE (PF) 2 MG/ML IV SOLN: 2.0000 mg | INTRAVENOUS | Status: DC | PRN

## 2021-11-30 MED ORDER — FENTANYL CITRATE PF 50 MCG/ML IJ SOSY
12.5000 ug | PREFILLED_SYRINGE | Freq: Once | INTRAMUSCULAR | Status: AC
  Administered 2021-11-30: 23:00:00 12.5 ug via INTRAVENOUS
  Filled 2021-11-30: qty 1

## 2021-11-30 MED ORDER — ONDANSETRON HCL 4 MG PO TABS: 4.0000 mg | ORAL_TABLET | Freq: Four times a day (QID) | ORAL | Status: DC | PRN

## 2021-11-30 MED ORDER — IBUPROFEN 800 MG PO TABS
800.0000 mg | ORAL_TABLET | Freq: Once | ORAL | Status: AC
  Administered 2021-11-30: 21:00:00 800 mg via ORAL
  Filled 2021-11-30: qty 1

## 2021-11-30 MED ORDER — ONDANSETRON HCL 4 MG/2ML IJ SOLN
4.0000 mg | Freq: Once | INTRAMUSCULAR | Status: AC
  Administered 2021-11-30: 23:00:00 4 mg via INTRAVENOUS
  Filled 2021-11-30: qty 2

## 2021-11-30 MED ORDER — ONDANSETRON HCL 4 MG/2ML IJ SOLN: 4.0000 mg | Freq: Four times a day (QID) | INTRAMUSCULAR | Status: DC | PRN

## 2021-11-30 NOTE — Progress Notes (Signed)
Orthopedic Tech Progress Note Patient Details:  Marilyn Nicholson 21-Oct-1946 [id number]  Musculoskeletal Traction Type of Traction: Bucks Skin Traction Traction Location: LLE Traction Weight: 10 lbs   Post Interventions Patient Tolerated: Fair Instructions Provided: Other (comment)  [name] 11/30/2021, 11:02 PM

## 2021-11-30 NOTE — H&P (Signed)
History and Physical   Marilyn Nicholson:[id number] DOB: 1946/03/30 DOA: 11/30/2021  Referring MD/NP/PA: Dr. Rhunette Croft  PCP: [name], MD   Patient coming from: Home   Chief Complaint: Fall with left hip pain  HPI: Marilyn Nicholson is a 75 y.o. female with medical history significant of essential hypertension, osteoporosis who apparently was helping her husband out of a car when she turned and fell down.  She landed on her left side and immediately felt sharp pain and a pop.  Patient was brought to the ER by EMS where she was found to have left intertrochanteric femoral neck fracture.  She has not had any other injuries.  Did not hit her head.  It was purely mechanical fall.  Patient is hemodynamically stable.  Evidence of mild anemia with hemoglobin of 9 g and leukocytosis otherwise not on any blood thinner.  Dr Roda Shutters of orthopedics has been consulted.  Patient likely to have repair soon.  We are admitting her to the medical service for preoperative clearance and medical management..  ED Course: Temperature 98.7, blood pressure 190/83, pulse 79 respiratory 24 oxygen sat 97% on room air.  Her white count is 16.8 hemoglobin 9.5 and platelets 402.  Sodium 133 potassium 3.7 chloride 105.  CO2 18 BUN 18 creatinine 0.86 and calcium 8.9.  Acute viral screen is pending.  X-ray of the left femur showed the left intertrochanteric fracture.  X-ray of the left hip also shows a same fracture.  Patient will be admitted for preoperative work-up and management.  Review of Systems: As per HPI otherwise 10 point review of systems negative.    Past Medical History:  Diagnosis Date   Hypertension     No past surgical history on file.   reports that she has been smoking cigarettes. She has a 32.25 pack-year smoking history. She does not have any smokeless tobacco history on file. No history on file for alcohol use and drug use.  Allergies  Allergen Reactions   Phenylpropanolamine Rash    Family History   Problem Relation Age of Onset   Breast cancer Neg Hx      Prior to Admission medications   Not on File    Physical Exam: Vitals:   11/30/21 2045 11/30/21 2215 11/30/21 2245 11/30/21 2315  BP: (!) 169/76 (!) 161/74 (!) 157/75 (!) 143/74  Pulse: 78 79 76 73  Resp: (!) 21 19 18  (!) 24  Temp:      TempSrc:      SpO2: 100% 97% 98% 98%      Constitutional: Frail, acutely ill looking, no distress Vitals:   11/30/21 2045 11/30/21 2215 11/30/21 2245 11/30/21 2315  BP: (!) 169/76 (!) 161/74 (!) 157/75 (!) 143/74  Pulse: 78 79 76 73  Resp: (!) 21 19 18  (!) 24  Temp:      TempSrc:      SpO2: 100% 97% 98% 98%   Eyes: PERRL, lids and conjunctivae normal ENMT: Mucous membranes are moist. Posterior pharynx clear of any exudate or lesions.Normal dentition.  Neck: normal, supple, no masses, no thyromegaly Respiratory: clear to auscultation bilaterally, no wheezing, no crackles. Normal respiratory effort. No accessory muscle use.  Cardiovascular: Regular rate and rhythm, no murmurs / rubs / gallops. No extremity edema. 2+ pedal pulses. No carotid bruits.  Abdomen: no tenderness, no masses palpated. No hepatosplenomegaly. Bowel sounds positive.  Musculoskeletal: no clubbing / cyanosis.  Left lower extremity laterally rotated and shortened,, no contractures. Normal muscle tone.  Skin:  no rashes, lesions, ulcers. No induration Neurologic: CN 2-12 grossly intact. Sensation intact, DTR normal. Strength 5/5 in all 4.  Psychiatric: Normal judgment and insight. Alert and oriented x 3. Normal mood.     Labs on Admission: I have personally reviewed following labs and imaging studies  CBC: Recent Labs  Lab 11/30/21 2053  WBC 16.8*  NEUTROABS 11.7*  HGB 9.5*  HCT 29.3*  MCV 88.0  PLT [id number]*   Basic Metabolic Panel: Recent Labs  Lab 11/30/21 2053  NA 133*  K 3.7  CL 105  CO2 18*  GLUCOSE 91  BUN 18  CREATININE 0.86  CALCIUM 8.9   GFR: CrCl cannot be calculated (Unknown ideal  weight.). Liver Function Tests: No results for input(s): AST, ALT, ALKPHOS, BILITOT, PROT, ALBUMIN in the last 168 hours. No results for input(s): LIPASE, AMYLASE in the last 168 hours. No results for input(s): AMMONIA in the last 168 hours. Coagulation Profile: No results for input(s): INR, PROTIME in the last 168 hours. Cardiac Enzymes: No results for input(s): CKTOTAL, CKMB, CKMBINDEX, TROPONINI in the last 168 hours. BNP (last 3 results) No results for input(s): PROBNP in the last 8760 hours. HbA1C: No results for input(s): HGBA1C in the last 72 hours. CBG: No results for input(s): GLUCAP in the last 168 hours. Lipid Profile: No results for input(s): CHOL, HDL, LDLCALC, TRIG, CHOLHDL, LDLDIRECT in the last 72 hours. Thyroid Function Tests: No results for input(s): TSH, T4TOTAL, FREET4, T3FREE, THYROIDAB in the last 72 hours. Anemia Panel: No results for input(s): VITAMINB12, FOLATE, FERRITIN, TIBC, IRON, RETICCTPCT in the last 72 hours. Urine analysis: No results found for: COLORURINE, APPEARANCEUR, LABSPEC, PHURINE, GLUCOSEU, HGBUR, BILIRUBINUR, KETONESUR, PROTEINUR, UROBILINOGEN, NITRITE, LEUKOCYTESUR Sepsis Labs: @LABRCNTIP (procalcitonin:4,lacticidven:4) )No results found for this or any previous visit (from the past 240 hour(s)).   Radiological Exams on Admission: DG Hip Unilat With Pelvis 2-3 Views Left  Result Date: 11/30/2021 CLINICAL DATA:  Fall and trauma to the left hip. EXAM: LEFT FEMUR 1 VIEW; DG HIP (WITH OR WITHOUT PELVIS) 2-3V LEFT COMPARISON:  Abdominal radiograph dated 11/12/2014. FINDINGS: There is a displaced and comminuted intertrochanteric fracture of the left femur. No dislocation. The bones are osteopenic. The soft tissues are unremarkable. Vascular calcifications noted. IMPRESSION: Displaced and comminuted intertrochanteric fracture of the left femur. Electronically Signed   By: Anner Crete M.D.   On: 11/30/2021 21:40   DG Femur 1V Left  Result  Date: 11/30/2021 CLINICAL DATA:  Fall and trauma to the left hip. EXAM: LEFT FEMUR 1 VIEW; DG HIP (WITH OR WITHOUT PELVIS) 2-3V LEFT COMPARISON:  Abdominal radiograph dated 11/12/2014. FINDINGS: There is a displaced and comminuted intertrochanteric fracture of the left femur. No dislocation. The bones are osteopenic. The soft tissues are unremarkable. Vascular calcifications noted. IMPRESSION: Displaced and comminuted intertrochanteric fracture of the left femur. Electronically Signed   By: Anner Crete M.D.   On: 11/30/2021 21:40      Assessment/Plan Principal Problem:   Displaced intertrochanteric fracture of left femur, initial encounter for closed fracture [facility]) Active Problems:   Arthritis   Hyperlipidemia   Osteoporosis, post-menopausal   Essential hypertension   Leucocytosis   Anemia     #1 left displaced intertrochanteric fracture: Secondary to mechanical fall.  Orthopedics consulted.  Patient likely to have surgery.  She is hemodynamically stable and medically cleared.  I will hold off on anticoagulation tonight.  SCD.  Immobilization by [employer].  Defer to orthopedic surgery.  DVT prophylaxis postoperatively.  #2 osteoarthritis: Chronic.  She also has osteoporosis.  Continue home regimen.  #3 osteoporosis: Postmenopausal.  Patient would likely need ongoing calcium and vitamin D.  She may require bone density scan postoperatively.  #4 essential hypertension: Confirm on resume her home regimen.  #5 hyperlipidemia: We will confirm and resume home regimen.  #6 leukocytosis: We will check her UA.  Could be due to inflammation.  Monitor white count  #7 normocytic anemia: Monitor H&H.   DVT prophylaxis: Lovenox postoperatively Code Status: Full code Family Communication: Husband Disposition Plan: To be determined Consults called: Dr. Erlinda Hong, orthopedic surgery Admission status: Inpatient  Severity of Illness: The appropriate patient status for this patient is INPATIENT.  Inpatient status is judged to be reasonable and necessary in order to provide the required intensity of service to ensure the patient's safety. The patient's presenting symptoms, physical exam findings, and initial radiographic and laboratory data in the context of their chronic comorbidities is felt to place them at high risk for further clinical deterioration. Furthermore, it is not anticipated that the patient will be medically stable for discharge from the hospital within 2 midnights of admission.   * I certify that at the point of admission it is my clinical judgment that the patient will require inpatient hospital care spanning beyond 2 midnights from the point of admission due to high intensity of service, high risk for further deterioration and high frequency of surveillance required.Barbette Merino MD [facility] Pager [phone]  If 7PM-7AM, please contact night-coverage [email or web address] Password [email or web address]  11/30/2021, 11:56 PM

## 2021-11-30 NOTE — ED Triage Notes (Addendum)
Pt BIB [facility], pt was helping her husband out of the car when she turned and fell. C/o left hip pain, EMS reports deformity, no rotation or shortening. GCS 15, given 4mg  morphine by ems.

## 2021-11-30 NOTE — ED Provider Notes (Signed)
[facility] EMERGENCY DEPARTMENT Provider Note   CSN: [id number] Arrival date & time: 11/30/21  2004     History Chief Complaint  Patient presents with   Hip Pain    Marilyn Nicholson is a 75 y.o. female.  With past medical history of hypertension who presents to the emergency department after a fall.  Patient states that prior to arrival to the emergency department she was getting out of her car.  She states that she turned to hand back off to her son.  She states when she turned to close the door she fell against the car and then fell onto her left side.  She denies hitting her head or loss of consciousness.  She now complains of pain to the left hip and the left elbow.  She denies lightheadedness or dizziness, syncope, chest pain or palpitations prior to or after the fall.  She is not anticoagulated.   Hip Pain Pertinent negatives include no chest pain, no headaches and no shortness of breath.      Past Medical History:  Diagnosis Date   Hypertension     There are no problems to display for this patient.   No past surgical history on file.   OB History   No obstetric history on file.     Family History  Problem Relation Age of Onset   Breast cancer Neg Hx     Social History   Tobacco Use   Smoking status: Every Day    Packs/day: 0.75    Years: 43.00    Pack years: 32.25    Types: Cigarettes    Home Medications Prior to Admission medications   Not on File    Allergies    Phenylpropanolamine  Review of Systems   Review of Systems  Respiratory:  Negative for shortness of breath.   Cardiovascular:  Negative for chest pain and palpitations.  Gastrointestinal:  Negative for nausea.  Musculoskeletal:  Positive for arthralgias, gait problem and myalgias. Negative for neck pain and neck stiffness.  Neurological:  Negative for dizziness, syncope, light-headedness and headaches.  All other systems reviewed and are negative.  Physical  Exam Updated Vital Signs BP (!) 190/83    Pulse 75    Temp 98.7 F (37.1 C) (Oral)    Resp 12    SpO2 100%   Physical Exam Vitals and nursing note reviewed.  Constitutional:      General: She is not in acute distress.    Appearance: She is not ill-appearing or toxic-appearing.  HENT:     Head: Normocephalic and atraumatic.     Mouth/Throat:     Mouth: Mucous membranes are moist.     Pharynx: Oropharynx is clear.  Eyes:     General: No scleral icterus.    Extraocular Movements: Extraocular movements intact.     Pupils: Pupils are equal, round, and reactive to light.  Cardiovascular:     Rate and Rhythm: Normal rate.     Pulses: Normal pulses.     Heart sounds: Normal heart sounds. No murmur heard. Pulmonary:     Effort: Pulmonary effort is normal. No respiratory distress.     Breath sounds: Normal breath sounds.  Abdominal:     Palpations: Abdomen is soft.  Musculoskeletal:        General: Tenderness and signs of injury present.     Cervical back: Normal range of motion and neck supple. No tenderness.     Right hip: Normal.  Left hip: Deformity, tenderness and bony tenderness present. Decreased range of motion.     Left lower leg: No edema.     Comments: Left leg is abducted, externally rotated and shortened.  Tenderness to palpation.  Skin:    General: Skin is warm and dry.     Capillary Refill: Capillary refill takes less than 2 seconds.     Findings: Bruising present. No rash.          Comments: Skin tear to the left elbow  Neurological:     General: No focal deficit present.     Mental Status: She is alert and oriented to person, place, and time. Mental status is at baseline.  Psychiatric:        Mood and Affect: Mood normal.        Behavior: Behavior normal.        Thought Content: Thought content normal.        Judgment: Judgment normal.    ED Results / Procedures / Treatments   Labs (all labs ordered are listed, but only abnormal results are  displayed) Labs Reviewed  BASIC METABOLIC PANEL - Abnormal; Notable for the following components:      Result Value   Sodium 133 (*)    CO2 18 (*)    All other components within normal limits  CBC WITH DIFFERENTIAL/PLATELET - Abnormal; Notable for the following components:   WBC 16.8 (*)    RBC 3.33 (*)    Hemoglobin 9.5 (*)    HCT 29.3 (*)    RDW 15.7 (*)    Platelets 402 (*)    Neutro Abs 11.7 (*)    Monocytes Absolute 1.1 (*)    Abs Immature Granulocytes 0.09 (*)    All other components within normal limits  RESP PANEL BY RT-PCR (FLU A&B, COVID) ARPGX2  TYPE AND SCREEN   EKG None  Radiology DG Hip Unilat With Pelvis 2-3 Views Left  Result Date: 11/30/2021 CLINICAL DATA:  Fall and trauma to the left hip. EXAM: LEFT FEMUR 1 VIEW; DG HIP (WITH OR WITHOUT PELVIS) 2-3V LEFT COMPARISON:  Abdominal radiograph dated 11/12/2014. FINDINGS: There is a displaced and comminuted intertrochanteric fracture of the left femur. No dislocation. The bones are osteopenic. The soft tissues are unremarkable. Vascular calcifications noted. IMPRESSION: Displaced and comminuted intertrochanteric fracture of the left femur. Electronically Signed   By: Elgie Collard M.D.   On: 11/30/2021 21:40   DG Femur 1V Left  Result Date: 11/30/2021 CLINICAL DATA:  Fall and trauma to the left hip. EXAM: LEFT FEMUR 1 VIEW; DG HIP (WITH OR WITHOUT PELVIS) 2-3V LEFT COMPARISON:  Abdominal radiograph dated 11/12/2014. FINDINGS: There is a displaced and comminuted intertrochanteric fracture of the left femur. No dislocation. The bones are osteopenic. The soft tissues are unremarkable. Vascular calcifications noted. IMPRESSION: Displaced and comminuted intertrochanteric fracture of the left femur. Electronically Signed   By: Elgie Collard M.D.   On: 11/30/2021 21:40    Procedures Procedures   Medications Ordered in ED Medications  fentaNYL (SUBLIMAZE) injection 12.5 mcg (has no administration in time range)   ondansetron (ZOFRAN) injection 4 mg (has no administration in time range)  ibuprofen (ADVIL) tablet 800 mg (800 mg Oral Given 11/30/21 2052)   ED Course  I have reviewed the triage vital signs and the nursing notes.  Pertinent labs & imaging results that were available during my care of the patient were reviewed by me and considered in my medical decision making (see chart  for details).    MDM Rules/Calculators/A&P 75 year old female who presents to the emergency department with left hip pain after fall.  Mechanical fall.  She denies any chest pain, palpitations, lightheadedness or dizziness, tunnel vision or syncope prior to or after the fall.  She denies hitting her head or loss of consciousness. Radiograph imaging shows displaced and comminuted intertrochanteric fracture of the left femur. Physical exam is consistent with abducted, externally rotated and slightly shortened left leg.  She does have tenderness to palpation. Initially very hesitant about pain medication.  I gave her 800 mg ibuprofen.  On reassessment the patient is still uncomfortable and at this time more willing to try pain medication.  We will start with very low-dose fentanyl with Zofran and reassess -Given that she did not hit her head, there is no loss of consciousness, her neurological exam is within normal limits and she has not on blood thinners defer head and neck CT at this time. 2211: Spoke with Dr. Erlinda Hong, orthopedic surgery who requests NPO and 10lbs bucks traction now.  2216: Spoke with Dr. Jonelle Sidle, hospitalist who agrees to admit the patient at this time.  Final Clinical Impression(s) / ED Diagnoses Final diagnoses:  Closed displaced intertrochanteric fracture of left femur, initial encounter [facility])    Rx / [place] Orders ED Discharge Orders     None        Mickie Hillier, PA-C 11/30/21 2238    Varney Biles, MD 11/30/21 2349

## 2021-11-30 NOTE — ED Notes (Signed)
Ortho tech at bedside. Pt transferred to hospital bed and ortho tech placed pt in bucks traction.

## 2021-12-01 ENCOUNTER — Inpatient Hospital Stay (HOSPITAL_COMMUNITY): Payer: Medicare Other | Admitting: Anesthesiology

## 2021-12-01 ENCOUNTER — Inpatient Hospital Stay (HOSPITAL_COMMUNITY): Payer: Medicare Other

## 2021-12-01 ENCOUNTER — Encounter (HOSPITAL_COMMUNITY): Payer: Self-pay | Admitting: Internal Medicine

## 2021-12-01 ENCOUNTER — Encounter (HOSPITAL_COMMUNITY)
Admission: EM | Disposition: A | Discharge status: Home Health | Payer: Self-pay | Source: Home / Self Care | Attending: Family Medicine

## 2021-12-01 DIAGNOSIS — S72142A Displaced intertrochanteric fracture of left femur, initial encounter for closed fracture: Principal | ICD-10-CM

## 2021-12-01 HISTORY — PX: INTRAMEDULLARY (IM) NAIL INTERTROCHANTERIC: SHX5875

## 2021-12-01 LAB — COMPREHENSIVE METABOLIC PANEL
ALT: 15 U/L (ref 0–44)
AST: 27 U/L (ref 15–41)
Albumin: 3.2 g/dL — ABNORMAL LOW (ref 3.5–5.0)
Alkaline Phosphatase: 60 U/L (ref 38–126)
Anion gap: 10 (ref 5–15)
BUN: 16 mg/dL (ref 8–23)
CO2: 20 mmol/L — ABNORMAL LOW (ref 22–32)
Calcium: 8.3 mg/dL — ABNORMAL LOW (ref 8.9–10.3)
Chloride: 105 mmol/L (ref 98–111)
Creatinine, Ser: 0.82 mg/dL (ref 0.44–1.00)
GFR, Estimated: >60 mL/min (ref >60)
Glucose, Bld: 105 mg/dL — ABNORMAL HIGH (ref 70–99)
Potassium: 4 mmol/L (ref 3.5–5.1)
Sodium: 135 mmol/L (ref 135–145)
Total Bilirubin: 0.7 mg/dL (ref 0.3–1.2)
Total Protein: 5.8 g/dL — ABNORMAL LOW (ref 6.5–8.1)

## 2021-12-01 LAB — CBC
HCT: 27.4 % — ABNORMAL LOW (ref 36.0–46.0)
Hemoglobin: 8.7 g/dL — ABNORMAL LOW (ref 12.0–15.0)
MCH: 28.1 pg (ref 26.0–34.0)
MCHC: 31.8 g/dL (ref 30.0–36.0)
MCV: 88.4 fL (ref 80.0–100.0)
Platelets: 344 10*3/uL (ref 150–400)
RBC: 3.1 MIL/uL — ABNORMAL LOW (ref 3.87–5.11)
RDW: 15.6 % — ABNORMAL HIGH (ref 11.5–15.5)
WBC: 11.9 10*3/uL — ABNORMAL HIGH (ref 4.0–10.5)
nRBC: 0 % (ref 0.0–0.2)

## 2021-12-01 LAB — RESP PANEL BY RT-PCR (FLU A&B, COVID) ARPGX2
Influenza A by PCR: NEGATIVE
Influenza B by PCR: NEGATIVE
SARS Coronavirus 2 by RT PCR: NEGATIVE

## 2021-12-01 LAB — SURGICAL PCR SCREEN
MRSA, PCR: NEGATIVE
Staphylococcus aureus: POSITIVE — AB

## 2021-12-01 SURGERY — FIXATION, FRACTURE, INTERTROCHANTERIC, WITH INTRAMEDULLARY ROD
Anesthesia: Spinal | Laterality: Left

## 2021-12-01 MED ORDER — ENOXAPARIN SODIUM 40 MG/0.4ML IJ SOSY
40.0000 mg | PREFILLED_SYRINGE | INTRAMUSCULAR | Status: DC
  Administered 2021-12-02 – 2021-12-05 (×4): 40 mg via SUBCUTANEOUS
  Filled 2021-12-01 (×4): qty 0.4

## 2021-12-01 MED ORDER — LACTATED RINGERS IV SOLN: INTRAVENOUS | Status: DC | PRN

## 2021-12-01 MED ORDER — IPRATROPIUM-ALBUTEROL 0.5-2.5 (3) MG/3ML IN SOLN: 3.0000 mL | RESPIRATORY_TRACT | Status: DC | PRN

## 2021-12-01 MED ORDER — ACETAMINOPHEN 500 MG PO TABS
1000.0000 mg | ORAL_TABLET | Freq: Four times a day (QID) | ORAL | Status: AC
  Administered 2021-12-02 (×2): 1000 mg via ORAL
  Filled 2021-12-01 (×4): qty 2

## 2021-12-01 MED ORDER — KETAMINE HCL 10 MG/ML IJ SOLN
INTRAMUSCULAR | Status: DC | PRN
  Administered 2021-12-01: 20 mg via INTRAVENOUS

## 2021-12-01 MED ORDER — TRANEXAMIC ACID 1000 MG/10ML IV SOLN
2000.0000 mg | INTRAVENOUS | Status: DC
  Filled 2021-12-01 (×2): qty 20

## 2021-12-01 MED ORDER — PROPOFOL 500 MG/50ML IV EMUL
INTRAVENOUS | Status: DC | PRN
  Administered 2021-12-01: 50 ug/kg/min via INTRAVENOUS

## 2021-12-01 MED ORDER — ACETAMINOPHEN 10 MG/ML IV SOLN
INTRAVENOUS | Status: DC | PRN
  Administered 2021-12-01: 1000 mg via INTRAVENOUS

## 2021-12-01 MED ORDER — ACETAMINOPHEN 325 MG PO TABS
325.0000 mg | ORAL_TABLET | Freq: Four times a day (QID) | ORAL | Status: DC | PRN
  Administered 2021-12-02 – 2021-12-04 (×2): 650 mg via ORAL
  Filled 2021-12-01 (×2): qty 2

## 2021-12-01 MED ORDER — ACETAMINOPHEN 10 MG/ML IV SOLN
INTRAVENOUS | Status: AC
  Filled 2021-12-01: qty 100

## 2021-12-01 MED ORDER — METHOCARBAMOL 500 MG PO TABS: 500.0000 mg | ORAL_TABLET | Freq: Four times a day (QID) | ORAL | Status: DC | PRN

## 2021-12-01 MED ORDER — ONDANSETRON HCL 4 MG PO TABS: 4.0000 mg | ORAL_TABLET | Freq: Four times a day (QID) | ORAL | Status: DC | PRN

## 2021-12-01 MED ORDER — DEXTROSE-NACL 5-0.45 % IV SOLN: INTRAVENOUS | Status: DC

## 2021-12-01 MED ORDER — POLYETHYLENE GLYCOL 3350 17 G PO PACK: 17.0000 g | PACK | Freq: Every day | ORAL | Status: DC | PRN

## 2021-12-01 MED ORDER — METHOCARBAMOL 1000 MG/10ML IJ SOLN
500.0000 mg | Freq: Four times a day (QID) | INTRAVENOUS | Status: DC | PRN
  Filled 2021-12-01: qty 5

## 2021-12-01 MED ORDER — ONDANSETRON HCL 4 MG/2ML IJ SOLN: 4.0000 mg | Freq: Once | INTRAMUSCULAR | Status: DC | PRN

## 2021-12-01 MED ORDER — CHLORHEXIDINE GLUCONATE 0.12 % MT SOLN: 15.0000 mL | Freq: Once | OROMUCOSAL | Status: AC

## 2021-12-01 MED ORDER — ONDANSETRON HCL 4 MG/2ML IJ SOLN
INTRAMUSCULAR | Status: DC | PRN
  Administered 2021-12-01: 4 mg via INTRAVENOUS

## 2021-12-01 MED ORDER — TRANEXAMIC ACID-NACL 1000-0.7 MG/100ML-% IV SOLN
1000.0000 mg | INTRAVENOUS | Status: AC
  Administered 2021-12-01: 17:00:00 1000 mg via INTRAVENOUS

## 2021-12-01 MED ORDER — ENOXAPARIN SODIUM 40 MG/0.4ML IJ SOSY: 40.0000 mg | PREFILLED_SYRINGE | Freq: Every day | INTRAMUSCULAR | 0 refills | Status: AC

## 2021-12-01 MED ORDER — CEFAZOLIN SODIUM-DEXTROSE 2-4 GM/100ML-% IV SOLN
INTRAVENOUS | Status: AC
  Filled 2021-12-01: qty 100

## 2021-12-01 MED ORDER — OXYCODONE HCL 5 MG PO TABS: 5.0000 mg | ORAL_TABLET | ORAL | Status: DC | PRN

## 2021-12-01 MED ORDER — DEXAMETHASONE SODIUM PHOSPHATE 10 MG/ML IJ SOLN
INTRAMUSCULAR | Status: DC | PRN
  Administered 2021-12-01: 10 mg via INTRAVENOUS

## 2021-12-01 MED ORDER — ALUM & MAG HYDROXIDE-SIMETH 200-200-20 MG/5ML PO SUSP: 30.0000 mL | ORAL | Status: DC | PRN

## 2021-12-01 MED ORDER — CEFAZOLIN SODIUM-DEXTROSE 2-4 GM/100ML-% IV SOLN
2.0000 g | Freq: Three times a day (TID) | INTRAVENOUS | Status: AC
  Administered 2021-12-02 (×3): 2 g via INTRAVENOUS
  Filled 2021-12-01 (×3): qty 100

## 2021-12-01 MED ORDER — FENTANYL CITRATE (PF) 250 MCG/5ML IJ SOLN
INTRAMUSCULAR | Status: AC
  Filled 2021-12-01: qty 5

## 2021-12-01 MED ORDER — OXYCODONE HCL 5 MG PO TABS
10.0000 mg | ORAL_TABLET | ORAL | Status: DC | PRN
  Administered 2021-12-02 – 2021-12-03 (×3): 10 mg via ORAL
  Filled 2021-12-01 (×2): qty 2

## 2021-12-01 MED ORDER — SORBITOL 70 % SOLN
30.0000 mL | Freq: Every day | Status: DC | PRN
  Filled 2021-12-01: qty 30

## 2021-12-01 MED ORDER — CEFAZOLIN SODIUM-DEXTROSE 2-4 GM/100ML-% IV SOLN
2.0000 g | INTRAVENOUS | Status: AC
  Administered 2021-12-01: 17:00:00 2 g via INTRAVENOUS

## 2021-12-01 MED ORDER — TRANEXAMIC ACID-NACL 1000-0.7 MG/100ML-% IV SOLN
INTRAVENOUS | Status: AC
  Filled 2021-12-01: qty 100

## 2021-12-01 MED ORDER — TRANEXAMIC ACID-NACL 1000-0.7 MG/100ML-% IV SOLN
1000.0000 mg | Freq: Once | INTRAVENOUS | Status: AC
  Administered 2021-12-01: 21:00:00 1000 mg via INTRAVENOUS
  Filled 2021-12-01: qty 100

## 2021-12-01 MED ORDER — LACTATED RINGERS IV SOLN: INTRAVENOUS | Status: DC

## 2021-12-01 MED ORDER — BUPIVACAINE IN DEXTROSE 0.75-8.25 % IT SOLN
INTRATHECAL | Status: DC | PRN
  Administered 2021-12-01: 1.4 mL via INTRATHECAL

## 2021-12-01 MED ORDER — PROPOFOL 10 MG/ML IV BOLUS
INTRAVENOUS | Status: AC
  Filled 2021-12-01: qty 20

## 2021-12-01 MED ORDER — CHLORHEXIDINE GLUCONATE 0.12 % MT SOLN
OROMUCOSAL | Status: AC
  Administered 2021-12-01: 16:00:00 15 mL via OROMUCOSAL
  Filled 2021-12-01: qty 15

## 2021-12-01 MED ORDER — POVIDONE-IODINE 10 % EX SWAB
2.0000 "application " | Freq: Once | CUTANEOUS | Status: AC
  Administered 2021-12-01: 2 via TOPICAL

## 2021-12-01 MED ORDER — DOCUSATE SODIUM 100 MG PO CAPS
100.0000 mg | ORAL_CAPSULE | Freq: Two times a day (BID) | ORAL | Status: DC
  Administered 2021-12-01 – 2021-12-05 (×8): 100 mg via ORAL
  Filled 2021-12-01 (×8): qty 1

## 2021-12-01 MED ORDER — OXYCODONE-ACETAMINOPHEN 5-325 MG PO TABS: 1.0000 | ORAL_TABLET | Freq: Three times a day (TID) | ORAL | 0 refills | Status: AC | PRN

## 2021-12-01 MED ORDER — KETAMINE HCL 50 MG/5ML IJ SOSY
PREFILLED_SYRINGE | INTRAMUSCULAR | Status: AC
  Filled 2021-12-01: qty 5

## 2021-12-01 MED ORDER — OXYCODONE HCL 5 MG PO TABS
5.0000 mg | ORAL_TABLET | ORAL | Status: DC | PRN
  Administered 2021-12-01 – 2021-12-03 (×4): 5 mg via ORAL
  Filled 2021-12-01 (×2): qty 1
  Filled 2021-12-01: qty 2
  Filled 2021-12-01 (×3): qty 1

## 2021-12-01 MED ORDER — METOPROLOL TARTRATE 5 MG/5ML IV SOLN: 5.0000 mg | INTRAVENOUS | Status: DC | PRN

## 2021-12-01 MED ORDER — ONDANSETRON HCL 4 MG/2ML IJ SOLN
4.0000 mg | Freq: Four times a day (QID) | INTRAMUSCULAR | Status: DC | PRN
  Administered 2021-12-02: 16:00:00 4 mg via INTRAVENOUS
  Filled 2021-12-01: qty 2

## 2021-12-01 MED ORDER — SODIUM CHLORIDE 0.9 % IV SOLN: INTRAVENOUS | Status: DC

## 2021-12-01 MED ORDER — DM-GUAIFENESIN ER 30-600 MG PO TB12: 1.0000 | ORAL_TABLET | Freq: Two times a day (BID) | ORAL | Status: DC | PRN

## 2021-12-01 MED ORDER — HYDROMORPHONE HCL 1 MG/ML IJ SOLN
0.5000 mg | INTRAMUSCULAR | Status: DC | PRN
  Administered 2021-12-04: 05:00:00 1 mg via INTRAVENOUS
  Filled 2021-12-01: qty 1

## 2021-12-01 MED ORDER — PHENOL 1.4 % MT LIQD: 1.0000 | OROMUCOSAL | Status: DC | PRN

## 2021-12-01 MED ORDER — FENTANYL CITRATE (PF) 100 MCG/2ML IJ SOLN: 25.0000 ug | INTRAMUSCULAR | Status: DC | PRN

## 2021-12-01 MED ORDER — HYDRALAZINE HCL 20 MG/ML IJ SOLN: 10.0000 mg | INTRAMUSCULAR | Status: DC | PRN

## 2021-12-01 MED ORDER — MENTHOL 3 MG MT LOZG: 1.0000 | LOZENGE | OROMUCOSAL | Status: DC | PRN

## 2021-12-01 MED ORDER — PROPOFOL 10 MG/ML IV BOLUS
INTRAVENOUS | Status: DC | PRN
  Administered 2021-12-01: 30 mg via INTRAVENOUS

## 2021-12-01 MED ORDER — TRAZODONE HCL 50 MG PO TABS: 50.0000 mg | ORAL_TABLET | Freq: Every evening | ORAL | Status: DC | PRN

## 2021-12-01 MED ORDER — ORAL CARE MOUTH RINSE: 15.0000 mL | Freq: Once | OROMUCOSAL | Status: AC

## 2021-12-01 SURGICAL SUPPLY — 43 items
BAG COUNTER SPONGE SURGICOUNT (BAG) ×2 IMPLANT
BAG SURGICOUNT SPONGE COUNTING (BAG) ×1
BIT DRILL INTERTAN LAG SCREW (BIT) ×2 IMPLANT
BNDG COHESIVE 4X5 TAN STRL (GAUZE/BANDAGES/DRESSINGS) ×3 IMPLANT
BNDG COHESIVE 6X5 TAN STRL LF (GAUZE/BANDAGES/DRESSINGS) IMPLANT
BNDG GAUZE ELAST 4 BULKY (GAUZE/BANDAGES/DRESSINGS) ×3 IMPLANT
COVER PERINEAL POST (MISCELLANEOUS) ×3 IMPLANT
COVER SURGICAL LIGHT HANDLE (MISCELLANEOUS) ×3 IMPLANT
DRAPE C-ARMOR (DRAPES) ×3 IMPLANT
DRAPE STERI IOBAN 125X83 (DRAPES) ×3 IMPLANT
DRESSING MEPILEX FLEX 4X4 (GAUZE/BANDAGES/DRESSINGS) IMPLANT
DRSG MEPILEX BORDER 4X4 (GAUZE/BANDAGES/DRESSINGS) ×3 IMPLANT
DRSG MEPILEX BORDER 4X8 (GAUZE/BANDAGES/DRESSINGS) ×3 IMPLANT
DRSG MEPILEX FLEX 4X4 (GAUZE/BANDAGES/DRESSINGS) ×6
DRSG PAD ABDOMINAL 8X10 ST (GAUZE/BANDAGES/DRESSINGS) ×6 IMPLANT
DURAPREP 26ML APPLICATOR (WOUND CARE) ×3 IMPLANT
ELECT REM PT RETURN 9FT ADLT (ELECTROSURGICAL)
ELECTRODE REM PT RTRN 9FT ADLT (ELECTROSURGICAL) ×1 IMPLANT
GLOVE SURG LTX SZ7 (GLOVE) ×3 IMPLANT
GLOVE SURG UNDER POLY LF SZ7 (GLOVE) ×60 IMPLANT
GLOVE SURG UNDER POLY LF SZ7.5 (GLOVE) ×12 IMPLANT
GOWN STRL REIN XL XLG (GOWN DISPOSABLE) ×3 IMPLANT
GUIDE PIN 3.2X343 (PIN) ×3
GUIDE PIN 3.2X343MM (PIN) ×6
KIT BASIN OR (CUSTOM PROCEDURE TRAY) ×3 IMPLANT
KIT TURNOVER KIT B (KITS) ×3 IMPLANT
MANIFOLD NEPTUNE II (INSTRUMENTS) ×1 IMPLANT
NAIL TRIGEN INTERTAN 10S 340MM (Orthopedic Implant) ×2 IMPLANT
NS IRRIG 1000ML POUR BTL (IV SOLUTION) ×3 IMPLANT
PACK GENERAL/GYN (CUSTOM PROCEDURE TRAY) ×3 IMPLANT
PAD ARMBOARD 7.5X6 YLW CONV (MISCELLANEOUS) ×6 IMPLANT
PAD CAST 4YDX4 CTTN HI CHSV (CAST SUPPLIES) ×2 IMPLANT
PADDING CAST COTTON 4X4 STRL (CAST SUPPLIES) ×4
PIN GUIDE 3.2X343MM (PIN) IMPLANT
SCREW LAG COMPR KIT 85/80 (Screw) ×2 IMPLANT
STAPLER VISISTAT 35W (STAPLE) ×3 IMPLANT
SUT VIC AB 0 CT1 27 (SUTURE) ×2
SUT VIC AB 0 CT1 27XBRD ANBCTR (SUTURE) ×1 IMPLANT
SUT VIC AB 2-0 CT1 27 (SUTURE) ×2
SUT VIC AB 2-0 CT1 TAPERPNT 27 (SUTURE) ×1 IMPLANT
TOWEL GREEN STERILE (TOWEL DISPOSABLE) ×3 IMPLANT
TOWEL GREEN STERILE FF (TOWEL DISPOSABLE) ×3 IMPLANT
WATER STERILE IRR 1000ML POUR (IV SOLUTION) ×3 IMPLANT

## 2021-12-01 NOTE — Discharge Instructions (Signed)
° ° °  1. Change dressings as needed °2. May shower but keep incisions covered and dry °3. Take lovenox to prevent blood clots °4. Take stool softeners as needed °5. Take pain meds as needed ° °

## 2021-12-01 NOTE — Anesthesia Procedure Notes (Signed)
Spinal  Patient location during procedure: OR Start time: 12/01/2021 4:35 PM End time: 12/01/2021 4:45 PM Reason for block: surgical anesthesia Staffing Performed: anesthesiologist  Anesthesiologist: Cecile Hearing, MD Preanesthetic Checklist Completed: patient identified, IV checked, risks and benefits discussed, surgical consent, monitors and equipment checked, pre-op evaluation and timeout performed Spinal Block Patient position: left lateral decubitus Prep: DuraPrep and site prepped and draped Patient monitoring: continuous pulse ox and blood pressure Approach: midline Location: L3-4 Injection technique: single-shot Needle Needle type: Pencan  Needle gauge: 24 G Assessment Sensory level: T8 Events: CSF return Additional Notes Functioning IV was confirmed and monitors were applied. Sterile prep and drape, including hand hygiene, mask and sterile gloves were used. The patient was positioned and the spine was prepped. The skin was anesthetized with lidocaine.  Free flow of clear CSF was obtained prior to injecting local anesthetic into the CSF.  The spinal needle aspirated freely following injection.  The needle was carefully withdrawn.  The patient tolerated the procedure well. Consent was obtained prior to procedure with all questions answered and concerns addressed. Risks including but not limited to bleeding, infection, nerve damage, paralysis, failed block, inadequate analgesia, allergic reaction, high spinal, itching and headache were discussed and the patient wished to proceed.   Arrie Aran, MD

## 2021-12-01 NOTE — Transfer of Care (Signed)
Immediate Anesthesia Transfer of Care Note  Patient: Marilyn Nicholson  Procedure(s) Performed: INTRAMEDULLARY (IM) NAIL INTERTROCHANTRIC (Left)  Patient Location: PACU  Anesthesia Type:Spinal  Level of Consciousness: awake, alert  and patient cooperative  Airway & Oxygen Therapy: Patient Spontanous Breathing  Post-op Assessment: Report given to RN and Post -op Vital signs reviewed and stable  Post vital signs: Reviewed and stable  Last Vitals:  Vitals Value Taken Time  BP 142/63 12/01/21 1743  Temp    Pulse 66 12/01/21 1743  Resp 22 12/01/21 1743  SpO2 100 % 12/01/21 1743  Vitals shown include unvalidated device data.  Last Pain:  Vitals:   12/01/21 1543  TempSrc: Oral  PainSc: 0-No pain      Patients Stated Pain Goal: 2 (12/01/21 1543)  Complications: No notable events documented.

## 2021-12-01 NOTE — Op Note (Signed)
° °  Date of Surgery: 12/01/2021  INDICATIONS: Marilyn Nicholson is a 75 y.o.-year-old female who sustained a left hip fracture. The risks and benefits of the procedure discussed with the Patient prior to the procedure and all questions were answered; consent was obtained.  PREOPERATIVE DIAGNOSIS: left intertrochanteric hip fracture   POSTOPERATIVE DIAGNOSIS: Same   PROCEDURE: Open treatment of intertrochanteric fracture with intramedullary implant. CPT [id number]   SURGEON: N. Glee Arvin, M.D.   ASSIST: None  ANESTHESIA: general   IV FLUIDS AND URINE: See anesthesia record   ESTIMATED BLOOD LOSS: 200 cc  IMPLANTS: [employer] InterTAN 10 x 340  DRAINS: None.   COMPLICATIONS: see description of procedure.   DESCRIPTION OF PROCEDURE: The patient was brought to the operating room and placed supine on the operating table. The patient's leg had been signed prior to the procedure. The patient had the anesthesia placed by the anesthesiologist. The prep verification and incision time-outs were performed to confirm that this was the correct patient, site, side and location. The patient had an SCD on the opposite lower extremity. The patient did receive antibiotics prior to the incision and was re-dosed during the procedure as needed at indicated intervals. The patient was positioned on the fracture table with the table in traction and internal rotation to reduce the hip. The well leg was placed in a scissor position and all bony prominences were well-padded. The patient had the lower extremity prepped and draped in the standard surgical fashion. The incision was made 4 finger breadths superior to the greater trochanter. A guide pin was inserted into the tip of the greater trochanter under fluoroscopic guidance. An opening reamer was used to gain access to the femoral canal. The nail length was measured and inserted down the femoral canal to its proper depth. The appropriate version of insertion for the lag  screw was found under fluoroscopy. A pin was inserted up the femoral neck through the jig. Then, a second antirotation pin was inserted inferior to the first pin. The length of the lag screw was then measured. The lag screw was inserted as near to center-center in the head as possible. The antirotation pin was then taken out and an interdigitating compression screw was placed in its place. The leg was taken out of traction, then the interdigitating compression screw was used to compress across the fracture. Compression was visualized on serial xrays.  I felt that the fracture pattern was stable so I did not put a distal interlock.  The wound was copiously irrigated with saline and the subcutaneous layer closed with 2.0 vicryl and the skin was reapproximated with staples. The wounds were cleaned and dried a final time and a sterile dressing was placed. The hip was taken through a range of motion at the end of the case under fluoroscopic imaging to visualize the approach-withdraw phenomenon and confirm implant length in the head. The patient was then awakened from anesthesia and taken to the recovery room in stable condition. All counts were correct at the end of the case.   POSTOPERATIVE PLAN: The patient will be weight bearing as tolerated and will return in 2 weeks for staple removal and the patient will receive DVT prophylaxis based on other medications, activity level, and risk ratio of bleeding to thrombosis.   Marilyn Reel, MD [facility] 5:55 PM

## 2021-12-01 NOTE — Progress Notes (Signed)
PROGRESS NOTE    Marilyn Nicholson  [id number] DOB: 01/22/1946 DOA: 11/30/2021 PCP: Juluis Pitch, MD   Brief Narrative:  75 year old with history of HTN, osteoporosis brought to the hospital after sustaining a fall while trying to get out of car.  She was found to have left hip fracture, orthopedic was consulted.   Assessment & Plan:   Principal Problem:   Displaced intertrochanteric fracture of left femur, initial encounter for closed fracture [facility]) Active Problems:   Arthritis   Hyperlipidemia   Osteoporosis, post-menopausal   Essential hypertension   Leucocytosis   Anemia   Closed left displaced intertrochanteric hip fracture - Orthopedic consulted.  Pain control.  PT/OT postop.  Likely will require surgical repair.  Bowel regimen.  Currently she is n.p.o.,  In the meantime D5 half-normal saline  Leukocytosis, improving - Likely reactive.  Follow-up UA.  Osteoarthritis - Pain control  Essential hypertension - Does not appear to be any home meds.  We will place her on IV hydralazine as needed and Lopressor  Hyperlipidemia - Not on any home meds.  Normocytic anemia -Hemoglobin stable around 9.0.  Continue to monitor   DVT prophylaxis: Postop Lovenox Code Status: Full code Family Communication:  Called [facility] Miller and [facility], no answer.   Status is: Inpatient  Remains inpatient appropriate because: Plan for OR per ortho for left hip fracture.     Subjective: Leg is in traction, feeling ok. No new complaints.   Review of Systems Otherwise negative except as per HPI, including: General: Denies fever, chills, night sweats or unintended weight loss. Resp: Denies cough, wheezing, shortness of breath. Cardiac: Denies chest pain, palpitations, orthopnea, paroxysmal nocturnal dyspnea. GI: Denies abdominal pain, nausea, vomiting, diarrhea or constipation GU: Denies dysuria, frequency, hesitancy or incontinence MS: Denies muscle aches, joint pain or swelling Neuro:  Denies headache, neurologic deficits (focal weakness, numbness, tingling), abnormal gait Psych: Denies anxiety, depression, SI/HI/AVH Skin: Denies new rashes or lesions ID: Denies sick contacts, exotic exposures, travel  Examination:  General exam: Appears calm and comfortable  Respiratory system: Clear to auscultation. Respiratory effort normal. Cardiovascular system: S1 & S2 heard, RRR. No JVD, murmurs, rubs, gallops or clicks. No pedal edema. Gastrointestinal system: Abdomen is nondistended, soft and nontender. No organomegaly or masses felt. Normal bowel sounds heard. Central nervous system: Alert and oriented. No focal neurological deficits. Extremities: LLE in traction.  Skin: No rashes, lesions or ulcers Psychiatry: Judgement and insight appear normal. Mood & affect appropriate.     Objective: Vitals:   12/01/21 0545 12/01/21 0600 12/01/21 0700 12/01/21 0728  BP: (!) 152/73 (!) 146/71 140/72 (!) 145/81  Pulse: 68 71 71 76  Resp: 11 18 13 18   Temp:    98.1 F (36.7 C)  TempSrc:    Oral  SpO2: 98% 98% 98% 98%   No intake or output data in the 24 hours ending 12/01/21 0822 There were no vitals filed for this visit.   Data Reviewed:   CBC: Recent Labs  Lab 11/30/21 2053 12/01/21 0425  WBC 16.8* 11.9*  NEUTROABS 11.7*  --   HGB 9.5* 8.7*  HCT 29.3* 27.4*  MCV 88.0 88.4  PLT 402* [id number]   Basic Metabolic Panel: Recent Labs  Lab 11/30/21 2053 12/01/21 0425  NA 133* 135  K 3.7 4.0  CL 105 105  CO2 18* 20*  GLUCOSE 91 105*  BUN 18 16  CREATININE 0.86 0.82  CALCIUM 8.9 8.3*   GFR: CrCl cannot be calculated (Unknown ideal weight.). Liver  Function Tests: Recent Labs  Lab 12/01/21 0425  AST 27  ALT 15  ALKPHOS 60  BILITOT 0.7  PROT 5.8*  ALBUMIN 3.2*   No results for input(s): LIPASE, AMYLASE in the last 168 hours. No results for input(s): AMMONIA in the last 168 hours. Coagulation Profile: No results for input(s): INR, PROTIME in the last 168  hours. Cardiac Enzymes: No results for input(s): CKTOTAL, CKMB, CKMBINDEX, TROPONINI in the last 168 hours. BNP (last 3 results) No results for input(s): PROBNP in the last 8760 hours. HbA1C: No results for input(s): HGBA1C in the last 72 hours. CBG: No results for input(s): GLUCAP in the last 168 hours. Lipid Profile: No results for input(s): CHOL, HDL, LDLCALC, TRIG, CHOLHDL, LDLDIRECT in the last 72 hours. Thyroid Function Tests: No results for input(s): TSH, T4TOTAL, FREET4, T3FREE, THYROIDAB in the last 72 hours. Anemia Panel: No results for input(s): VITAMINB12, FOLATE, FERRITIN, TIBC, IRON, RETICCTPCT in the last 72 hours. Sepsis Labs: No results for input(s): PROCALCITON, LATICACIDVEN in the last 168 hours.  Recent Results (from the past 240 hour(s))  Resp Panel by RT-PCR (Flu A&B, Covid) Nasopharyngeal Swab     Status: None   Collection Time: 11/30/21 10:39 PM   Specimen: Nasopharyngeal Swab; Nasopharyngeal(NP) swabs in vial transport medium  Result Value Ref Range Status   SARS Coronavirus 2 by RT PCR NEGATIVE NEGATIVE Final    Comment: (NOTE) SARS-CoV-2 target nucleic acids are NOT DETECTED.  The SARS-CoV-2 RNA is generally detectable in upper respiratory specimens during the acute phase of infection. The lowest concentration of SARS-CoV-2 viral copies this assay can detect is 138 copies/mL. A negative result does not preclude SARS-Cov-2 infection and should not be used as the sole basis for treatment or other patient management decisions. A negative result may occur with  improper specimen collection/handling, submission of specimen other than nasopharyngeal swab, presence of viral mutation(s) within the areas targeted by this assay, and inadequate number of viral copies(<138 copies/mL). A negative result must be combined with clinical observations, patient history, and epidemiological information. The expected result is Negative.  Fact Sheet for Patients:   BloggerCourse.com  Fact Sheet for Healthcare Providers:  SeriousBroker.it  This test is no t yet approved or cleared by the [place] FDA and  has been authorized for detection and/or diagnosis of SARS-CoV-2 by FDA under an Emergency Use Authorization (EUA). This EUA will remain  in effect (meaning this test can be used) for the duration of the COVID-19 declaration under Section 564(b)(1) of the Act, 21 U.S.C.section 360bbb-3(b)(1), unless the authorization is terminated  or revoked sooner.       Influenza A by PCR NEGATIVE NEGATIVE Final   Influenza B by PCR NEGATIVE NEGATIVE Final    Comment: (NOTE) The Xpert Xpress SARS-CoV-2/FLU/RSV plus assay is intended as an aid in the diagnosis of influenza from Nasopharyngeal swab specimens and should not be used as a sole basis for treatment. Nasal washings and aspirates are unacceptable for Xpert Xpress SARS-CoV-2/FLU/RSV testing.  Fact Sheet for Patients: BloggerCourse.com  Fact Sheet for Healthcare Providers: SeriousBroker.it  This test is not yet approved or cleared by the [place] FDA and has been authorized for detection and/or diagnosis of SARS-CoV-2 by FDA under an Emergency Use Authorization (EUA). This EUA will remain in effect (meaning this test can be used) for the duration of the COVID-19 declaration under Section 564(b)(1) of the Act, 21 U.S.C. section 360bbb-3(b)(1), unless the authorization is terminated or revoked.  Performed at [facility]  [facility], [place] [address] [place], [place] [address]          Radiology Studies: DG Hip Unilat With Pelvis 2-3 Views Left  Result Date: 11/30/2021 CLINICAL DATA:  Fall and trauma to the left hip. EXAM: LEFT FEMUR 1 VIEW; DG HIP (WITH OR WITHOUT PELVIS) 2-3V LEFT COMPARISON:  Abdominal radiograph dated 11/12/2014. FINDINGS: There is a displaced and comminuted  intertrochanteric fracture of the left femur. No dislocation. The bones are osteopenic. The soft tissues are unremarkable. Vascular calcifications noted. IMPRESSION: Displaced and comminuted intertrochanteric fracture of the left femur. Electronically Signed   By: Anner Crete M.D.   On: 11/30/2021 21:40   DG Femur 1V Left  Result Date: 11/30/2021 CLINICAL DATA:  Fall and trauma to the left hip. EXAM: LEFT FEMUR 1 VIEW; DG HIP (WITH OR WITHOUT PELVIS) 2-3V LEFT COMPARISON:  Abdominal radiograph dated 11/12/2014. FINDINGS: There is a displaced and comminuted intertrochanteric fracture of the left femur. No dislocation. The bones are osteopenic. The soft tissues are unremarkable. Vascular calcifications noted. IMPRESSION: Displaced and comminuted intertrochanteric fracture of the left femur. Electronically Signed   By: Anner Crete M.D.   On: 11/30/2021 21:40        Scheduled Meds: Continuous Infusions:  sodium chloride 100 mL/hr at 12/01/21 0728     LOS: 1 day   Time spent= 35 mins    Jaedan Huttner Arsenio Loader, MD [facility]  If 7PM-7AM, please contact night-coverage  12/01/2021, 8:22 AM

## 2021-12-01 NOTE — Progress Notes (Signed)
PT Cancellation Note  Patient Details Name: Marilyn Nicholson MRN: [id number] DOB: 18-Aug-1946   Cancelled Treatment:    Reason Eval/Treat Not Completed: Medical issues which prohibited therapy Pt currently awaiting sx for L hip fx. Will follow up as schedule allows.   Cindee Salt, DPT  Acute Rehabilitation Services  Pager: [phone] Office: [phone]    Lehman Prom 12/01/2021, 8:47 AM

## 2021-12-01 NOTE — Consult Note (Signed)
ORTHOPAEDIC CONSULTATION  REQUESTING PHYSICIAN: Damita Lack, MD  Chief Complaint: Left IT hip fracture  HPI: 75 year old female suffered mechanical fall onto left hip and had immediate severe left hip pain.  Denies LOC.  Work up in ED showed left IT hip fx Ortho consulted for surgical evaluation.  Past Medical History:  Diagnosis Date   Hypertension    No past surgical history on file. Social History   Socioeconomic History   Marital status: Divorced    Spouse name: Not on file   Number of children: Not on file   Years of education: Not on file   Highest education level: Not on file  Occupational History   Not on file  Tobacco Use   Smoking status: Every Day    Packs/day: 0.75    Years: 43.00    Pack years: 32.25    Types: Cigarettes   Smokeless tobacco: Not on file  Substance and Sexual Activity   Alcohol use: Not on file   Drug use: Not on file   Sexual activity: Not on file  Other Topics Concern   Not on file  Social History Narrative   Not on file   Social Determinants of Health   Financial Resource Strain: Not on file  Food Insecurity: Not on file  Transportation Needs: Not on file  Physical Activity: Not on file  Stress: Not on file  Social Connections: Not on file   Family History  Problem Relation Age of Onset   Breast cancer Neg Hx    Allergies  Allergen Reactions   Phenylpropanolamine Rash   Prior to Admission medications   Medication Sig Start Date End Date Taking? Authorizing Provider  Calcium Carbonate-Vitamin D (OYSTER SHELL CALCIUM/D) 500-5 MG-MCG TABS Take 1 tablet by mouth daily.   Yes [provider]  ibuprofen (ADVIL) 200 MG tablet Take 400 mg by mouth every 6 (six) hours as needed for headache or moderate pain.   Yes [provider]  polyethylene glycol powder (GLYCOLAX/MIRALAX) 17 GM/SCOOP powder Take 17 g by mouth daily as needed for mild constipation.   Yes [provider]  vitamin B-12  (CYANOCOBALAMIN) 1000 MCG tablet Take 1,000 mcg by mouth daily.   Yes [provider]   DG Hip Unilat With Pelvis 2-3 Views Left  Result Date: 11/30/2021 CLINICAL DATA:  Fall and trauma to the left hip. EXAM: LEFT FEMUR 1 VIEW; DG HIP (WITH OR WITHOUT PELVIS) 2-3V LEFT COMPARISON:  Abdominal radiograph dated 11/12/2014. FINDINGS: There is a displaced and comminuted intertrochanteric fracture of the left femur. No dislocation. The bones are osteopenic. The soft tissues are unremarkable. Vascular calcifications noted. IMPRESSION: Displaced and comminuted intertrochanteric fracture of the left femur. Electronically Signed   By: Anner Crete M.D.   On: 11/30/2021 21:40   DG Femur 1V Left  Result Date: 11/30/2021 CLINICAL DATA:  Fall and trauma to the left hip. EXAM: LEFT FEMUR 1 VIEW; DG HIP (WITH OR WITHOUT PELVIS) 2-3V LEFT COMPARISON:  Abdominal radiograph dated 11/12/2014. FINDINGS: There is a displaced and comminuted intertrochanteric fracture of the left femur. No dislocation. The bones are osteopenic. The soft tissues are unremarkable. Vascular calcifications noted. IMPRESSION: Displaced and comminuted intertrochanteric fracture of the left femur. Electronically Signed   By: Anner Crete M.D.   On: 11/30/2021 21:40    All pertinent xrays, MRI, CT independently reviewed and interpreted  Positive ROS: All other systems have been reviewed and were otherwise negative with the exception of  those mentioned in the HPI and as above.  Physical Exam: General: No acute distress Cardiovascular: No pedal edema Respiratory: No cyanosis, no use of accessory musculature GI: No organomegaly, abdomen is soft and non-tender Skin: No lesions in the area of chief complaint Neurologic: Sensation intact distally Psychiatric: Patient is at baseline mood and affect Lymphatic: No axillary or cervical lymphadenopathy  MUSCULOSKELETAL:  - severe pain with movement of the hip and extremity - skin  intact - NVI distally - compartments soft  Assessment: Left IT fracture  Plan: - surgical treatment is recommended for pain relief, quality of life and early mobilization - patient and family are aware of r/b/a and wish to proceed, informed consent obtained - medical optimization per primary team - surgery is planned for this afternoon - keep NPO, hold anticoagulation - likely will need SNF   Thank you for the consult and the opportunity to see Ms. Vanhorn  N. Glee Arvin, MD [facility] 8:43 AM

## 2021-12-01 NOTE — ED Notes (Signed)
Pt transported to [facility] via hospital bed by NT at this time.

## 2021-12-01 NOTE — Anesthesia Preprocedure Evaluation (Addendum)
Anesthesia Evaluation  Patient identified by MRN, date of birth, ID band Patient awake    Reviewed: Allergy & Precautions, NPO status , Patient's Chart, lab work & pertinent test results  History of Anesthesia Complications Negative for: history of anesthetic complications  Airway Mallampati: II  TM Distance: >3 FB Neck ROM: Full    Dental  (+) Dental Advisory Given, Edentulous Upper, Missing, Poor Dentition   Pulmonary Current Smoker and Patient abstained from smoking.,    Pulmonary exam normal breath sounds clear to auscultation       Cardiovascular hypertension, Normal cardiovascular exam Rhythm:Regular Rate:Normal     Neuro/Psych negative neurological ROS  negative psych ROS   GI/Hepatic negative GI ROS, Neg liver ROS,   Endo/Other  negative endocrine ROS  Renal/GU negative Renal ROS     Musculoskeletal  (+) Arthritis , Left IT hip fracture   Abdominal   Peds  Hematology  (+) Blood dyscrasia, anemia , Plt 344k    Anesthesia Other Findings Day of surgery medications reviewed with the patient.  Reproductive/Obstetrics                            Anesthesia Physical Anesthesia Plan  ASA: 3 and emergent  Anesthesia Plan: Spinal   Post-op Pain Management: Ofirmev IV (intra-op) and Ketamine IV   Induction: Intravenous  PONV Risk Score and Plan: 1 and Propofol infusion, Dexamethasone and Ondansetron  Airway Management Planned: Natural Airway and Nasal Cannula  Additional Equipment:   Intra-op Plan:   Post-operative Plan:   Informed Consent: I have reviewed the patients History and Physical, chart, labs and discussed the procedure including the risks, benefits and alternatives for the proposed anesthesia with the patient or authorized representative who has indicated his/her understanding and acceptance.     Dental advisory given  Plan Discussed with: CRNA, Anesthesiologist  and Surgeon  Anesthesia Plan Comments:         Anesthesia Quick Evaluation

## 2021-12-01 NOTE — ED Notes (Signed)
Pt's stepson Junior would like to be updated with any status changes or when she gets transferred to room upstairs. His number is [phone]

## 2021-12-01 NOTE — Progress Notes (Signed)
PT Cancellation Note  Patient Details Name: Marilyn Nicholson MRN: [id number] DOB: [birth year]   Cancelled Treatment:    Reason Eval/Treat Not Completed: Medical issues which prohibited therapy.  Pt is pending surgery for left hip  fracture.  Will await surgery and post-op orders.     Marilyn Nicholson 12/01/2021, 8:47 AM  Marilyn Nicholson, PT   Acute Rehabilitation Services  Pager [phone] Office [phone] 12/01/2021

## 2021-12-02 ENCOUNTER — Encounter (HOSPITAL_COMMUNITY): Payer: Self-pay | Admitting: Orthopaedic Surgery

## 2021-12-02 DIAGNOSIS — S72142A Displaced intertrochanteric fracture of left femur, initial encounter for closed fracture: Secondary | ICD-10-CM | POA: Diagnosis not present

## 2021-12-02 LAB — BASIC METABOLIC PANEL
Anion gap: 8 (ref 5–15)
BUN: 13 mg/dL (ref 8–23)
CO2: 20 mmol/L — ABNORMAL LOW (ref 22–32)
Calcium: 8.2 mg/dL — ABNORMAL LOW (ref 8.9–10.3)
Chloride: 106 mmol/L (ref 98–111)
Creatinine, Ser: 0.99 mg/dL (ref 0.44–1.00)
GFR, Estimated: 59 mL/min — ABNORMAL LOW (ref >60)
Glucose, Bld: 124 mg/dL — ABNORMAL HIGH (ref 70–99)
Potassium: 4 mmol/L (ref 3.5–5.1)
Sodium: 134 mmol/L — ABNORMAL LOW (ref 135–145)

## 2021-12-02 LAB — CBC
HCT: 25.3 % — ABNORMAL LOW (ref 36.0–46.0)
Hemoglobin: 8.2 g/dL — ABNORMAL LOW (ref 12.0–15.0)
MCH: 28.3 pg (ref 26.0–34.0)
MCHC: 32.4 g/dL (ref 30.0–36.0)
MCV: 87.2 fL (ref 80.0–100.0)
Platelets: 296 10*3/uL (ref 150–400)
RBC: 2.9 MIL/uL — ABNORMAL LOW (ref 3.87–5.11)
RDW: 15.8 % — ABNORMAL HIGH (ref 11.5–15.5)
WBC: 11.6 10*3/uL — ABNORMAL HIGH (ref 4.0–10.5)
nRBC: 0 % (ref 0.0–0.2)

## 2021-12-02 LAB — MAGNESIUM: Magnesium: 2 mg/dL (ref 1.7–2.4)

## 2021-12-02 MED ORDER — MUPIROCIN 2 % EX OINT
1.0000 "application " | TOPICAL_OINTMENT | Freq: Two times a day (BID) | CUTANEOUS | Status: DC
  Administered 2021-12-02 – 2021-12-05 (×7): 1 via NASAL
  Filled 2021-12-02 (×2): qty 22

## 2021-12-02 MED ORDER — CHLORHEXIDINE GLUCONATE CLOTH 2 % EX PADS
6.0000 | MEDICATED_PAD | Freq: Every day | CUTANEOUS | Status: DC
  Administered 2021-12-03 – 2021-12-05 (×3): 6 via TOPICAL

## 2021-12-02 MED ORDER — CHLORHEXIDINE GLUCONATE CLOTH 2 % EX PADS
6.0000 | MEDICATED_PAD | Freq: Every day | CUTANEOUS | Status: DC
  Administered 2021-12-02 – 2021-12-05 (×4): 6 via TOPICAL

## 2021-12-02 NOTE — Evaluation (Signed)
Occupational Therapy Evaluation Patient Details Name: Marilyn Nicholson MRN: [id number] DOB: 1945/12/09 Today's Date: 12/02/2021   History of Present Illness Patient is a 75 y/o female who presents on 12/01/21 with left hip fx after a fall now s/p IM nail. PMH includes HTN.   Clinical Impression   Pt presents with decline in function and safety with ADLs and ADL mobility with impaired strength, balance and endurance. Pt reports that PTA she lived at home with her husband and was Ind with ADLs/selfcare and that she did not use an AD for mobility. Pt states that she husband and sister can assist her 24/7 at home. Pt currently required mod - min A for mobility using RW and BSC, min A with UB ADLs, max - total A with LB ADLs and toileting tasks. Pt would benefit from acute OT services to maximize level of function and safety     Recommendations for follow up therapy are one component of a multi-disciplinary discharge planning process, led by the attending physician.  Recommendations may be updated based on patient status, additional functional criteria and insurance authorization.   Follow Up Recommendations  Home health OT    Assistance Recommended at Discharge Frequent or constant Supervision/Assistance  Functional Status Assessment  Patient has had a recent decline in their functional status and demonstrates the ability to make significant improvements in function in a reasonable and predictable amount of time.  Equipment Recommendations  BSC/3in1;Tub/shower seat;Other (comment) (RW)    Recommendations for Other Services       Precautions / Restrictions Precautions Precautions: Fall Restrictions Weight Bearing Restrictions: Yes LLE Weight Bearing: Weight bearing as tolerated      Mobility Bed Mobility               General bed mobility comments: pt in recliner upon arrival    Transfers Overall transfer level: Needs assistance Equipment used: Rolling walker (2  wheels) Transfers: Sit to/from Stand Sit to Stand: Mod assist;Min assist           General transfer comment: Mod A to power to standing, min A SPT to BSC and back to recliner with cues for hand placement/technique, reluctant to place weight on L LE      Balance Overall balance assessment: Needs assistance Sitting-balance support: Feet supported;No upper extremity supported Sitting balance-Leahy Scale: Fair     Standing balance support: During functional activity;Reliant on assistive device for balance Standing balance-Leahy Scale: Poor                             ADL either performed or assessed with clinical judgement   ADL Overall ADL's : Needs assistance/impaired Eating/Feeding: Set up;Independent;Sitting   Grooming: Therapist, nutritional;Wash/dry hands;Min guard;Sitting   Upper Body Bathing: Minimal assistance;Sitting   Lower Body Bathing: Maximal assistance   Upper Body Dressing : Minimal assistance;Sitting   Lower Body Dressing: Total assistance   Toilet Transfer: Moderate assistance;Minimal assistance;Stand-pivot;Rolling walker (2 wheels);Cueing for safety;Cueing for sequencing;BSC/3in1   Toileting- Clothing Manipulation and Hygiene: Total assistance       Functional mobility during ADLs: Moderate assistance;Minimal assistance;Rolling walker (2 wheels);Cueing for safety;Cueing for sequencing       Vision Baseline Vision/History: 1 Wears glasses Ability to See in Adequate Light: 0 Adequate Patient Visual Report: No change from baseline       Perception     Praxis      Pertinent Vitals/Pain Pain Assessment: Faces Faces Pain Scale: Hurts even  more Pain Location: LLE with movement Pain Descriptors / Indicators: Sore;Operative site guarding;Grimacing;Guarding Pain Intervention(s): Monitored during session;Premedicated before session;Limited activity within patient's tolerance;Repositioned     Hand Dominance Right   Extremity/Trunk Assessment  Upper Extremity Assessment Upper Extremity Assessment: Generalized weakness   Lower Extremity Assessment Lower Extremity Assessment: Defer to PT evaluation   Cervical / Trunk Assessment Cervical / Trunk Assessment: Normal   Communication Communication Communication: No difficulties   Cognition Arousal/Alertness: Awake/alert Behavior During Therapy: WFL for tasks assessed/performed Overall Cognitive Status: Within Functional Limits for tasks assessed                                       General Comments       Exercises     Shoulder Instructions      Home Living Family/patient expects to be discharged to:: Private residence Living Arrangements: Spouse/significant other Available Help at Discharge: Available PRN/intermittently;Family Type of Home: Mobile home Home Access: Stairs to enter Entrance Stairs-Number of Steps: 4 + 2- getting ramp built Entrance Stairs-Rails: Right;Can reach both;Left Home Layout: One level     Bathroom Shower/Tub: Occupational psychologist: Handicapped height     Home Equipment: Shower seat - built in;Rollator (4 wheels);Rolling Walker (2 wheels);Cane - single point;BSC/3in1;Cane - quad          Prior Functioning/Environment Prior Level of Function : Independent/Modified Independent             Mobility Comments: Independent, drives, grocery shopping, cooking/cleaning ADLs Comments: independent, helps spouse with LB dressing at times        OT Problem List: Decreased strength;Impaired balance (sitting and/or standing);Pain;Decreased safety awareness;Decreased activity tolerance;Decreased coordination;Decreased knowledge of use of DME or AE      OT Treatment/Interventions: Self-care/ADL training;Patient/family education;Balance training;Therapeutic activities;DME and/or AE instruction    OT Goals(Current goals can be found in the care plan section) Acute Rehab OT Goals Patient Stated Goal: go home OT  Goal Formulation: With patient Time For Goal Achievement: 12/16/21 Potential to Achieve Goals: Good  OT Frequency: Min 2X/week   Barriers to D/C:            Co-evaluation              AM-PAC OT "6 Clicks" Daily Activity     Outcome Measure Help from another person eating meals?: None Help from another person taking care of personal grooming?: A Little Help from another person toileting, which includes using toliet, bedpan, or urinal?: Total Help from another person bathing (including washing, rinsing, drying)?: A Lot Help from another person to put on and taking off regular upper body clothing?: A Little Help from another person to put on and taking off regular lower body clothing?: Total 6 Click Score: 14   End of Session Equipment Utilized During Treatment: Gait belt;Rolling walker (2 wheels);Other (comment) (BSC)  Activity Tolerance: Patient limited by fatigue;No increased pain Patient left: in chair;with call bell/phone within reach;with chair alarm set  OT Visit Diagnosis: Unsteadiness on feet (R26.81);Other abnormalities of gait and mobility (R26.89);History of falling (Z91.81);Muscle weakness (generalized) (M62.81);Pain Pain - Right/Left: Left Pain - part of body: Hip;Leg                Time: 1026-1050 OT Time Calculation (min): 24 min Charges:  OT General Charges $OT Visit: 1 Visit OT Evaluation $OT Eval Moderate Complexity: 1 Mod OT Treatments $Self Care/Home  Management : 8-22 mins    Emmit Alexanders [facility] 12/02/2021, 2:10 PM

## 2021-12-02 NOTE — Progress Notes (Addendum)
PROGRESS NOTE    KAMIKA GOODLOE  XFG:[id number] DOB: 09/30/1946 DOA: 11/30/2021 PCP: [name], MD   Brief Narrative:  75 year old with history of HTN, osteoporosis brought to the hospital after sustaining a fall while trying to get out of car.  She was found to have left hip fracture, orthopedic was consulted.  Status post ORIF 12/01/2021.  Assessment & Plan:   Principal Problem:   Displaced intertrochanteric fracture of left femur, initial encounter for closed fracture [facility]) Active Problems:   Arthritis   Hyperlipidemia   Osteoporosis, post-menopausal   Essential hypertension   Leucocytosis   Anemia   Closed left displaced intertrochanteric hip fracture status post ORIF 12/01/2021. -S/p surgery.  Pain control.  PT/OT, weightbearing as tolerated.  Bowel regimen.  Defer DVT prophylaxis to orthopedic team.  Nausea -Still very nauseous, IV antiemetics prn. If doesn't improve, we will order AXR or CT scan.   Leukocytosis, improving - Likely reactive.  UA collection is still pending.   Osteoarthritis - Pain control  Essential hypertension - Does not appear to be any home meds.  IV hydralazine as needed and Lopressor  Hyperlipidemia - Not on any home meds.  Normocytic anemia -Hemoglobin stable around 9.0.  Continue to monitor   DVT prophylaxis: Lovenox Code Status: Full code Family Communication:  Called Becky, no answer.  Status is: Inpatient  Remains inpatient appropriate because: s/p ORIF, needs PT/OT and likely placement. Nauseous, poor oral intake.     Subjective: Sitting up in the chair, very nauseous. Poor oral intake.    Examination: Constitutional: Not in acute distress Respiratory: Clear to auscultation bilaterally Cardiovascular: Normal sinus rhythm, no rubs Abdomen: Nontender nondistended good bowel sounds Musculoskeletal: limited ROM of her surgical leg.  Skin: No rashes seen Neurologic: CN 2-12 grossly intact.  And nonfocal Psychiatric:  Normal judgment and insight. Alert and oriented x 3. Normal mood.    Objective: Vitals:   12/01/21 1900 12/01/21 1915 12/01/21 1945 12/02/21 0720  BP: (!) 167/77 (!) 152/76 125/79 119/66  Pulse: 67 71 77 75  Resp: 14 19 18    Temp:   98.2 F (36.8 C) 98.3 F (36.8 C)  TempSrc:   Oral Oral  SpO2: 98% 97% 98%   Weight:      Height:        Intake/Output Summary (Last 24 hours) at 12/02/2021 0754 Last data filed at 12/01/2021 1723 Gross per 24 hour  Intake 1392.11 ml  Output 2075 ml  Net -682.89 ml   Filed Weights   12/01/21 1543 12/01/21 1600  Weight: 47.6 kg 47.6 kg     Data Reviewed:   CBC: Recent Labs  Lab 11/30/21 2053 12/01/21 0425 12/02/21 0425  WBC 16.8* 11.9* 11.6*  NEUTROABS 11.7*  --   --   HGB 9.5* 8.7* 8.2*  HCT 29.3* 27.4* 25.3*  MCV 88.0 88.4 87.2  PLT 402* 344 296   Basic Metabolic Panel: Recent Labs  Lab 11/30/21 2053 12/01/21 0425 12/02/21 0425  NA 133* 135 134*  K 3.7 4.0 4.0  CL 105 105 106  CO2 18* 20* 20*  GLUCOSE 91 105* 124*  BUN 18 16 13   CREATININE 0.86 0.82 0.99  CALCIUM 8.9 8.3* 8.2*  MG  --   --  2.0   GFR: Estimated Creatinine Clearance: 33.5 mL/min (by C-G formula based on SCr of 0.99 mg/dL). Liver Function Tests: Recent Labs  Lab 12/01/21 0425  AST 27  ALT 15  ALKPHOS 60  BILITOT 0.7  PROT 5.8*  ALBUMIN 3.2*   No results for input(s): LIPASE, AMYLASE in the last 168 hours. No results for input(s): AMMONIA in the last 168 hours. Coagulation Profile: No results for input(s): INR, PROTIME in the last 168 hours. Cardiac Enzymes: No results for input(s): CKTOTAL, CKMB, CKMBINDEX, TROPONINI in the last 168 hours. BNP (last 3 results) No results for input(s): PROBNP in the last 8760 hours. HbA1C: No results for input(s): HGBA1C in the last 72 hours. CBG: No results for input(s): GLUCAP in the last 168 hours. Lipid Profile: No results for input(s): CHOL, HDL, LDLCALC, TRIG, CHOLHDL, LDLDIRECT in the last 72  hours. Thyroid Function Tests: No results for input(s): TSH, T4TOTAL, FREET4, T3FREE, THYROIDAB in the last 72 hours. Anemia Panel: No results for input(s): VITAMINB12, FOLATE, FERRITIN, TIBC, IRON, RETICCTPCT in the last 72 hours. Sepsis Labs: No results for input(s): PROCALCITON, LATICACIDVEN in the last 168 hours.  Recent Results (from the past 240 hour(s))  Resp Panel by RT-PCR (Flu A&B, Covid) Nasopharyngeal Swab     Status: None   Collection Time: 11/30/21 10:39 PM   Specimen: Nasopharyngeal Swab; Nasopharyngeal(NP) swabs in vial transport medium  Result Value Ref Range Status   SARS Coronavirus 2 by RT PCR NEGATIVE NEGATIVE Final    Comment: (NOTE) SARS-CoV-2 target nucleic acids are NOT DETECTED.  The SARS-CoV-2 RNA is generally detectable in upper respiratory specimens during the acute phase of infection. The lowest concentration of SARS-CoV-2 viral copies this assay can detect is 138 copies/mL. A negative result does not preclude SARS-Cov-2 infection and should not be used as the sole basis for treatment or other patient management decisions. A negative result may occur with  improper specimen collection/handling, submission of specimen other than nasopharyngeal swab, presence of viral mutation(s) within the areas targeted by this assay, and inadequate number of viral copies(<138 copies/mL). A negative result must be combined with clinical observations, patient history, and epidemiological information. The expected result is Negative.  Fact Sheet for Patients:  EntrepreneurPulse.com.au  Fact Sheet for Healthcare Providers:  IncredibleEmployment.be  This test is no t yet approved or cleared by the [place] FDA and  has been authorized for detection and/or diagnosis of SARS-CoV-2 by FDA under an Emergency Use Authorization (EUA). This EUA will remain  in effect (meaning this test can be used) for the duration of the COVID-19  declaration under Section 564(b)(1) of the Act, 21 U.S.C.section 360bbb-3(b)(1), unless the authorization is terminated  or revoked sooner.       Influenza A by PCR NEGATIVE NEGATIVE Final   Influenza B by PCR NEGATIVE NEGATIVE Final    Comment: (NOTE) The Xpert Xpress SARS-CoV-2/FLU/RSV plus assay is intended as an aid in the diagnosis of influenza from Nasopharyngeal swab specimens and should not be used as a sole basis for treatment. Nasal washings and aspirates are unacceptable for Xpert Xpress SARS-CoV-2/FLU/RSV testing.  Fact Sheet for Patients: EntrepreneurPulse.com.au  Fact Sheet for Healthcare Providers: IncredibleEmployment.be  This test is not yet approved or cleared by the [place] FDA and has been authorized for detection and/or diagnosis of SARS-CoV-2 by FDA under an Emergency Use Authorization (EUA). This EUA will remain in effect (meaning this test can be used) for the duration of the COVID-19 declaration under Section 564(b)(1) of the Act, 21 U.S.C. section 360bbb-3(b)(1), unless the authorization is terminated or revoked.  Performed at [facility], [place] [address] [place], [place] [address]   Surgical pcr screen     Status: Abnormal   Collection  Time: 12/01/21  3:01 PM   Specimen: Nasal Mucosa; Nasal Swab  Result Value Ref Range Status   MRSA, PCR NEGATIVE NEGATIVE Final   Staphylococcus aureus POSITIVE (A) NEGATIVE Final    Comment: (NOTE) The Xpert SA Assay (FDA approved for NASAL specimens in patients 66 years of age and older), is one component of a comprehensive surveillance program. It is not intended to diagnose infection nor to guide or monitor treatment. Performed at [facility], [place] [address], [place], [place] [address]          Radiology Studies: Pelvis Portable  Result Date: 12/01/2021 CLINICAL DATA:  Postop EXAM: PORTABLE PELVIS 1-2 VIEWS COMPARISON:  11/30/2021 FINDINGS:  Changes of internal fixation across the left femoral intertrochanteric fracture. Near anatomic alignment. Minimal residual displacement of the lesser trochanter. No hardware or bony complicating feature. IMPRESSION: Internal fixation.  No visible complicating feature. Electronically Signed   By: Rolm Baptise M.D.   On: 12/01/2021 18:08   DG C-Arm 1-60 Min-No Report  Result Date: 12/01/2021 Fluoroscopy was utilized by the requesting physician.  No radiographic interpretation.   DG Hip Unilat With Pelvis 2-3 Views Left  Result Date: 11/30/2021 CLINICAL DATA:  Fall and trauma to the left hip. EXAM: LEFT FEMUR 1 VIEW; DG HIP (WITH OR WITHOUT PELVIS) 2-3V LEFT COMPARISON:  Abdominal radiograph dated 11/12/2014. FINDINGS: There is a displaced and comminuted intertrochanteric fracture of the left femur. No dislocation. The bones are osteopenic. The soft tissues are unremarkable. Vascular calcifications noted. IMPRESSION: Displaced and comminuted intertrochanteric fracture of the left femur. Electronically Signed   By: Anner Crete M.D.   On: 11/30/2021 21:40   DG Femur 1V Left  Result Date: 11/30/2021 CLINICAL DATA:  Fall and trauma to the left hip. EXAM: LEFT FEMUR 1 VIEW; DG HIP (WITH OR WITHOUT PELVIS) 2-3V LEFT COMPARISON:  Abdominal radiograph dated 11/12/2014. FINDINGS: There is a displaced and comminuted intertrochanteric fracture of the left femur. No dislocation. The bones are osteopenic. The soft tissues are unremarkable. Vascular calcifications noted. IMPRESSION: Displaced and comminuted intertrochanteric fracture of the left femur. Electronically Signed   By: Anner Crete M.D.   On: 11/30/2021 21:40   DG FEMUR MIN 2 VIEWS LEFT  Result Date: 12/01/2021 CLINICAL DATA:  IM nail, internal fixation EXAM: LEFT FEMUR 2 VIEWS COMPARISON:  11/30/2021 FINDINGS: Multiple intraoperative spot images demonstrate internal fixation across the left femoral intertrochanteric fracture. No hardware  complicating feature. Near anatomic alignment. IMPRESSION: Internal fixation.  No visible complicating feature. Electronically Signed   By: Rolm Baptise M.D.   On: 12/01/2021 18:08        Scheduled Meds:  acetaminophen  1,000 mg Oral Q6H   docusate sodium  100 mg Oral BID   enoxaparin (LOVENOX) injection  40 mg Subcutaneous Q24H   Continuous Infusions:  sodium chloride 75 mL/hr at 12/01/21 2037    ceFAZolin (ANCEF) IV 2 g (12/02/21 0025)   dextrose 5 % and 0.45% NaCl 75 mL/hr at 12/01/21 0835   methocarbamol (ROBAXIN) IV       LOS: 2 days   Time spent= 35 mins    Kimari Lienhard Arsenio Loader, MD [facility]  If 7PM-7AM, please contact night-coverage  12/02/2021, 7:54 AM

## 2021-12-02 NOTE — Evaluation (Signed)
Physical Therapy Evaluation Patient Details Name: Marilyn Nicholson MRN: [id number] DOB: 1946-01-07 Today's Date: 12/02/2021  History of Present Illness  Patient is a 75 y/o female who presents on 12/01/21 with left hip fx after a fall now s/p IM nail. PMH includes HTN.  Clinical Impression  Patient presents with pain, post surgical deficits, impaired balance and impaired mobility s/p above. Pt independent and lives with spouse in a mobile home PTA. Today, pt requires Max A for bed mobility and Min A for transfers and initiation of gait training with use of RW for support. Limited mainly by pain. Anticipate, pt will do well with pain control and increased activity. Education re WB status, elevation, there ex, mobility, safety etc. Pt has support of sister and spouse at d/c. Will follow acutely to maximize independence and mobility prior to return home.     Recommendations for follow up therapy are one component of a multi-disciplinary discharge planning process, led by the attending physician.  Recommendations may be updated based on patient status, additional functional criteria and insurance authorization.  Follow Up Recommendations Home health PT    Assistance Recommended at Discharge Frequent or constant Supervision/Assistance  Functional Status Assessment Patient has had a recent decline in their functional status and demonstrates the ability to make significant improvements in function in a reasonable and predictable amount of time.  Equipment Recommendations  Rolling walker (2 wheels);BSC/3in1    Recommendations for Other Services       Precautions / Restrictions Precautions Precautions: Fall Restrictions Weight Bearing Restrictions: Yes LLE Weight Bearing: Weight bearing as tolerated      Mobility  Bed Mobility Overal bed mobility: Needs Assistance Bed Mobility: Supine to Sit     Supine to sit: HOB elevated;Max assist     General bed mobility comments: Assist with LLE,  scooting bottom and to elevate trunk to get to EOB, cues for technique and use of rail.    Transfers Overall transfer level: Needs assistance Equipment used: Rolling walker (2 wheels) Transfers: Sit to/from Stand Sit to Stand: Min assist           General transfer comment: Min A to power to standing with cues for hand placement/technique, reluctant to place weight on LLE upon standing. transferred to chair post ambulation.    Ambulation/Gait Ambulation/Gait assistance: Min assist Gait Distance (Feet): 4 Feet Assistive device: Rolling walker (2 wheels) Gait Pattern/deviations: Step-to pattern;Decreased weight shift to left;Decreased stance time - left;Knee flexed in stance - left;Trunk flexed Gait velocity: decreased Gait velocity interpretation: <1.31 ft/sec, indicative of household ambulator   General Gait Details: Slow, step to gait with cues for sequencing and RW management, Min A for balance. Reluctant to place weight through LLE due to pain.  Stairs            Wheelchair Mobility    Modified Rankin (Stroke Patients Only)       Balance Overall balance assessment: Needs assistance Sitting-balance support: Feet supported;No upper extremity supported Sitting balance-Leahy Scale: Fair     Standing balance support: During functional activity;Reliant on assistive device for balance Standing balance-Leahy Scale: Poor Standing balance comment: Min guard for safety, UE support needed on RW                             Pertinent Vitals/Pain Pain Assessment: 0-10 Pain Score: 8  Pain Location: LLE with movement Pain Descriptors / Indicators: Sore;Operative site guarding;Grimacing;Guarding Pain Intervention(s): Monitored during session;Patient  requesting pain meds-RN notified;Limited activity within patient's tolerance;Repositioned    Home Living Family/patient expects to be discharged to:: Private residence Living Arrangements: Spouse/significant  other Available Help at Discharge: Available PRN/intermittently;Family (sister can help) Type of Home: Mobile home Home Access: Stairs to enter Entrance Stairs-Rails: Right;Can reach both;Left Entrance Stairs-Number of Steps: 4 + 2- getting ramp built   Home Layout: One level Home Equipment: Shower seat - built in;Rollator (4 wheels);Rolling Walker (2 wheels);Cane - single point;BSC/3in1;Cane - quad      Prior Function Prior Level of Function : Independent/Modified Independent             Mobility Comments: Independent, drives, grocery shopping, cooking/cleaning ADLs Comments: independent, helps spouse with LB dressing at times     Hand Dominance   Dominant Hand: Right    Extremity/Trunk Assessment   Upper Extremity Assessment Upper Extremity Assessment: Defer to OT evaluation    Lower Extremity Assessment Lower Extremity Assessment: LLE deficits/detail LLE Deficits / Details: Decreased knee extension/flexion AROM, post surgical deficits LLE Sensation: WNL    Cervical / Trunk Assessment Cervical / Trunk Assessment: Normal  Communication   Communication: No difficulties  Cognition Arousal/Alertness: Awake/alert Behavior During Therapy: WFL for tasks assessed/performed Overall Cognitive Status: Within Functional Limits for tasks assessed                                          General Comments      Exercises General Exercises - Lower Extremity Ankle Circles/Pumps: AROM;Both;10 reps;Supine Quad Sets: AROM;Both;10 reps;Supine   Assessment/Plan    PT Assessment Patient needs continued PT services  PT Problem List Decreased mobility;Decreased range of motion;Pain;Decreased balance;Decreased knowledge of use of DME;Decreased activity tolerance;Decreased skin integrity       PT Treatment Interventions Therapeutic activities;Functional mobility training;Balance training;Stair training;Patient/family education;Therapeutic exercise;Gait  training;DME instruction;Modalities    PT Goals (Current goals can be found in the Care Plan section)  Acute Rehab PT Goals Patient Stated Goal: to get better and go home PT Goal Formulation: With patient Time For Goal Achievement: 12/16/21 Potential to Achieve Goals: Good    Frequency Min 3X/week   Barriers to discharge Inaccessible home environment stairs    Co-evaluation               AM-PAC PT "6 Clicks" Mobility  Outcome Measure Help needed turning from your back to your side while in a flat bed without using bedrails?: A Lot Help needed moving from lying on your back to sitting on the side of a flat bed without using bedrails?: Total Help needed moving to and from a bed to a chair (including a wheelchair)?: A Little Help needed standing up from a chair using your arms (e.g., wheelchair or bedside chair)?: A Little Help needed to walk in hospital room?: A Little Help needed climbing 3-5 steps with a railing? : A Lot 6 Click Score: 14    End of Session Equipment Utilized During Treatment: Gait belt Activity Tolerance: Patient limited by pain Patient left: in chair;with call bell/phone within reach;with chair alarm set Nurse Communication: Mobility status PT Visit Diagnosis: Pain;Difficulty in walking, not elsewhere classified (R26.2) Pain - Right/Left: Left Pain - part of body: Leg    Time: 8242-3536 PT Time Calculation (min) (ACUTE ONLY): 39 min   Charges:   PT Evaluation $PT Eval Moderate Complexity: 1 Mod PT Treatments $Therapeutic Activity: 23-37 mins  Zettie Cooley, DPT Acute Rehabilitation Services Pager [phone] Office [place] 12/02/2021, 9:33 AM

## 2021-12-02 NOTE — Plan of Care (Signed)

## 2021-12-02 NOTE — Progress Notes (Signed)
Subjective: 1 Day Post-Op Procedure(s) (LRB): INTRAMEDULLARY (IM) NAIL INTERTROCHANTRIC (Left) Patient reports pain as mild.    Objective: Vital signs in last 24 hours: Temp:  [98 F (36.7 C)-98.3 F (36.8 C)] 98.3 F (36.8 C) (12/27 0720) Pulse Rate:  [60-77] 75 (12/27 0720) Resp:  [14-22] 18 (12/26 1945) BP: (119-177)/(63-86) 119/66 (12/27 0720) SpO2:  [97 %-100 %] 98 % (12/26 1945) Weight:  [47.6 kg] 47.6 kg (12/26 1600)  Intake/Output from previous day: 12/26 0701 - 12/27 0700 In: 1392.1 [I.V.:1392.1] Out: 2075 [Urine:1875; Blood:200] Intake/Output this shift: Total I/O In: 240 [P.O.:240] Out: -   Recent Labs    11/30/21 2053 12/01/21 0425 12/02/21 0425  HGB 9.5* 8.7* 8.2*   Recent Labs    12/01/21 0425 12/02/21 0425  WBC 11.9* 11.6*  RBC 3.10* 2.90*  HCT 27.4* 25.3*  PLT 344 296   Recent Labs    12/01/21 0425 12/02/21 0425  NA 135 134*  K 4.0 4.0  CL 105 106  CO2 20* 20*  BUN 16 13  CREATININE 0.82 0.99  GLUCOSE 105* 124*  CALCIUM 8.3* 8.2*   No results for input(s): LABPT, INR in the last 72 hours.  Neurologically intact Neurovascular intact Sensation intact distally Intact pulses distally Dorsiflexion/Plantar flexion intact Incision: dressing C/D/I No cellulitis present Compartment soft   Assessment/Plan: 1 Day Post-Op Procedure(s) (LRB): INTRAMEDULLARY (IM) NAIL INTERTROCHANTRIC (Left) Up with therapy WBAT LLE ABLA- mild and stable Lovenox for dvt ppx F/u with Dr. Roda Shutters 2 weeks post-op      Marilyn Nicholson 12/02/2021, 3:51 PM

## 2021-12-02 NOTE — Anesthesia Postprocedure Evaluation (Signed)
Anesthesia Post Note  Patient: Marilyn Nicholson  Procedure(s) Performed: INTRAMEDULLARY (IM) NAIL INTERTROCHANTRIC (Left)     Patient location during evaluation: PACU Anesthesia Type: Spinal Level of consciousness: oriented, awake and alert and awake Pain management: pain level controlled Vital Signs Assessment: post-procedure vital signs reviewed and stable Respiratory status: spontaneous breathing, respiratory function stable and nonlabored ventilation Cardiovascular status: blood pressure returned to baseline and stable Postop Assessment: no headache, no backache, no apparent nausea or vomiting and spinal receding Anesthetic complications: no   No notable events documented.  Last Vitals:  Vitals:   12/01/21 1945 12/02/21 0720  BP: 125/79 119/66  Pulse: 77 75  Resp: 18   Temp: 36.8 C 36.8 C  SpO2: 98%     Last Pain:  Vitals:   12/02/21 1346  TempSrc:   PainSc: 2    Pain Goal: Patients Stated Pain Goal: 2 (12/01/21 1543)                 Cecile Hearing

## 2021-12-02 NOTE — Progress Notes (Signed)
Mobility Specialist Progress Note   12/02/21 1800  Mobility  Activity Ambulated in room;Ambulated in hall  Level of Assistance Minimal assist, patient does 75% or more  Assistive Device Front wheel walker  LLE Weight Bearing WBAT  Distance Ambulated (ft) 60 ft  Mobility Ambulated with assistance in hallway;Ambulated with assistance in room  Mobility Response Tolerated well  Mobility performed by Mobility specialist  $Mobility charge 1 Mobility   Received pt in chair c/o sight nausea but agreeable. Pt asx throughout but has a hitch in LLE and remains timid to put any weight on leg. Returned back to the chair where pt demonstrated standing weight shifting. Able tolerate minimally but capable of finishing session. Returned back to chair w/ call bell in reach.   Frederico Hamman Mobility Specialist Phone Number [phone]

## 2021-12-03 ENCOUNTER — Encounter (HOSPITAL_COMMUNITY): Payer: Self-pay | Admitting: Internal Medicine

## 2021-12-03 DIAGNOSIS — I10 Essential (primary) hypertension: Secondary | ICD-10-CM | POA: Diagnosis not present

## 2021-12-03 DIAGNOSIS — D649 Anemia, unspecified: Secondary | ICD-10-CM | POA: Diagnosis not present

## 2021-12-03 DIAGNOSIS — S72142A Displaced intertrochanteric fracture of left femur, initial encounter for closed fracture: Secondary | ICD-10-CM | POA: Diagnosis not present

## 2021-12-03 LAB — BASIC METABOLIC PANEL
Anion gap: 5 (ref 5–15)
BUN: 12 mg/dL (ref 8–23)
CO2: 22 mmol/L (ref 22–32)
Calcium: 7.9 mg/dL — ABNORMAL LOW (ref 8.9–10.3)
Chloride: 106 mmol/L (ref 98–111)
Creatinine, Ser: 0.87 mg/dL (ref 0.44–1.00)
GFR, Estimated: >60 mL/min (ref >60)
Glucose, Bld: 103 mg/dL — ABNORMAL HIGH (ref 70–99)
Potassium: 3.5 mmol/L (ref 3.5–5.1)
Sodium: 133 mmol/L — ABNORMAL LOW (ref 135–145)

## 2021-12-03 LAB — CBC
HCT: 21.6 % — ABNORMAL LOW (ref 36.0–46.0)
Hemoglobin: 7.3 g/dL — ABNORMAL LOW (ref 12.0–15.0)
MCH: 29.6 pg (ref 26.0–34.0)
MCHC: 33.8 g/dL (ref 30.0–36.0)
MCV: 87.4 fL (ref 80.0–100.0)
Platelets: 279 10*3/uL (ref 150–400)
RBC: 2.47 MIL/uL — ABNORMAL LOW (ref 3.87–5.11)
RDW: 15.9 % — ABNORMAL HIGH (ref 11.5–15.5)
WBC: 13.1 10*3/uL — ABNORMAL HIGH (ref 4.0–10.5)
nRBC: 0 % (ref 0.0–0.2)

## 2021-12-03 LAB — MAGNESIUM: Magnesium: 2.1 mg/dL (ref 1.7–2.4)

## 2021-12-03 NOTE — Plan of Care (Signed)

## 2021-12-03 NOTE — Progress Notes (Signed)
Physical Therapy Treatment Patient Details Name: JAMONI KENNAN MRN: [id number] DOB: 04-Nov-1946 Today's Date: 12/03/2021   History of Present Illness Patient is a 75 y/o female who presents on 12/01/21 with left hip fx after a fall now s/p IM nail. PMH includes HTN.    PT Comments    Pt is progressing slowly towards her physical therapy goals; she remains limited by pain and inability to weight bear through left lower extremity, despite premedication. Pt requiring about ~30 minutes to ambulate to bathroom and back. PT provided consistent, hands on assist for walker management and cues for sequencing, technique, and keeping left knee straight (tends to hold left lower extremity off the floor in knee flexion). Unsure if her husband, who uses a cane, can provide this level of assist. Pt states she is going to talk to other family members. She also has 6 steps to enter her home. Will continue to try to progress as tolerated.    Recommendations for follow up therapy are one component of a multi-disciplinary discharge planning process, led by the attending physician.  Recommendations may be updated based on patient status, additional functional criteria and insurance authorization.  Follow Up Recommendations  Home health PT (vs. SNF pending progression)     Assistance Recommended at Discharge Frequent or constant Supervision/Assistance  Equipment Recommendations  Rolling walker (2 wheels);BSC/3in1    Recommendations for Other Services       Precautions / Restrictions Precautions Precautions: Fall Restrictions Weight Bearing Restrictions: Yes LLE Weight Bearing: Weight bearing as tolerated     Mobility  Bed Mobility               General bed mobility comments: OOB on BSC    Transfers Overall transfer level: Needs assistance Equipment used: Rolling walker (2 wheels) Transfers: Sit to/from Stand Sit to Stand: Min assist           General transfer comment: Pt requiring  placement of LLE anteriorly, cues for hand placement, control at hips with lowering onto low surface of toilet    Ambulation/Gait Ambulation/Gait assistance: Min assist Gait Distance (Feet): 10 Feet (10 ft to toilet, 10 ft to recliner) Assistive device: Rolling walker (2 wheels) Gait Pattern/deviations: Step-to pattern;Decreased weight shift to left;Decreased stance time - left;Knee flexed in stance - left;Trunk flexed Gait velocity: decreased Gait velocity interpretation: <1.8 ft/sec, indicate of risk for recurrent falls   General Gait Details: Slowed, step to gait with left knee flexed in midstance and minimal to no weight through LLE. Pt requiring max multimodal cues for sequencing, technique, progressive weightbearing through LLE. consistent assist for walker management with progressing forwards and turning   Stairs             Wheelchair Mobility    Modified Rankin (Stroke Patients Only)       Balance Overall balance assessment: Needs assistance Sitting-balance support: Feet supported;No upper extremity supported Sitting balance-Leahy Scale: Fair     Standing balance support: Bilateral upper extremity supported;During functional activity Standing balance-Leahy Scale: Poor                              Cognition Arousal/Alertness: Awake/alert Behavior During Therapy: WFL for tasks assessed/performed Overall Cognitive Status: Within Functional Limits for tasks assessed  Exercises General Exercises - Lower Extremity Long Arc Quad: Both;10 reps;Seated Heel Slides: Left;10 reps;Seated Hip ABduction/ADduction: Both;10 reps;Seated    General Comments        Pertinent Vitals/Pain Pain Assessment: Faces Faces Pain Scale: Hurts worst Pain Location: LLE with movement Pain Descriptors / Indicators: Sore;Operative site guarding;Grimacing;Guarding Pain Intervention(s): Limited activity within  patient's tolerance;Monitored during session;Premedicated before session;Repositioned    Home Living                          Prior Function            PT Goals (current goals can now be found in the care plan section) Acute Rehab PT Goals Patient Stated Goal: to get better and go home Potential to Achieve Goals: Good Progress towards PT goals: Progressing toward goals    Frequency    Min 5X/week      PT Plan Frequency needs to be updated    Co-evaluation              AM-PAC PT "6 Clicks" Mobility   Outcome Measure  Help needed turning from your back to your side while in a flat bed without using bedrails?: A Little Help needed moving from lying on your back to sitting on the side of a flat bed without using bedrails?: A Little Help needed moving to and from a bed to a chair (including a wheelchair)?: A Little Help needed standing up from a chair using your arms (e.g., wheelchair or bedside chair)?: A Little Help needed to walk in hospital room?: A Lot (max cueing) Help needed climbing 3-5 steps with a railing? : Total 6 Click Score: 15    End of Session Equipment Utilized During Treatment: Gait belt Activity Tolerance: Patient limited by pain Patient left: in chair;with call bell/phone within reach;with chair alarm set Nurse Communication: Mobility status PT Visit Diagnosis: Pain;Difficulty in walking, not elsewhere classified (R26.2) Pain - Right/Left: Left Pain - part of body: Leg     Time: 1202-1238 PT Time Calculation (min) (ACUTE ONLY): 36 min  Charges:  $Gait Training: 8-22 mins $Therapeutic Activity: 8-22 mins                     Lillia Pauls, PT, DPT Acute Rehabilitation Services Pager [phone] Office [phone]    Norval Morton 12/03/2021, 1:48 PM

## 2021-12-03 NOTE — Progress Notes (Signed)
Subjective: 2 Days Post-Op Procedure(s) (LRB): INTRAMEDULLARY (IM) NAIL INTERTROCHANTRIC (Left) Patient reports pain as mild.    Objective: Vital signs in last 24 hours: Temp:  [98.8 F (37.1 C)] 98.8 F (37.1 C) (12/27 2006) Pulse Rate:  [77] 77 (12/27 2006) Resp:  [16] 16 (12/27 2006) BP: (138)/(62) 138/62 (12/27 2006) SpO2:  [99 %] 99 % (12/27 2006)  Intake/Output from previous day: 12/27 0701 - 12/28 0700 In: 240 [P.O.:240] Out: -  Intake/Output this shift: No intake/output data recorded.  Recent Labs    11/30/21 2053 12/01/21 0425 12/02/21 0425 12/03/21 0246  HGB 9.5* 8.7* 8.2* 7.3*   Recent Labs    12/02/21 0425 12/03/21 0246  WBC 11.6* 13.1*  RBC 2.90* 2.47*  HCT 25.3* 21.6*  PLT 296 279   Recent Labs    12/02/21 0425 12/03/21 0246  NA 134* 133*  K 4.0 3.5  CL 106 106  CO2 20* 22  BUN 13 12  CREATININE 0.99 0.87  GLUCOSE 124* 103*  CALCIUM 8.2* 7.9*   No results for input(s): LABPT, INR in the last 72 hours.  Neurologically intact Neurovascular intact Sensation intact distally Intact pulses distally Dorsiflexion/Plantar flexion intact Incision: dressing C/D/I No cellulitis present Compartment soft   Assessment/Plan: 2 Days Post-Op Procedure(s) (LRB): INTRAMEDULLARY (IM) NAIL INTERTROCHANTRIC (Left) PLAN Weightbearing: WBAT LLE Insicional and dressing care: Dressings left intact until follow-up Orthopedic device(s): None Showering: must cover bandage VTE prophylaxis: Lovenox 40mg  qd  x 2 weeks Pain control: percocet Follow - up plan: 2 weeks Contact information:  MD, Glee Arvin, lindsey PA       Dub Mikes 12/03/2021, 7:42 AM

## 2021-12-03 NOTE — Progress Notes (Signed)
[facility]  PROGRESS NOTE  Marilyn Nicholson HQI:[id number] DOB: [birth year] DOA: 11/30/2021 PCP: [name], MD   Brief HPI:   75 year old female with history of hypertension, osteoporosis, brought to hospital after sustaining a fall while trying to get out of car   She was found to have left hip fracture.  Orthopedic surgery was consulted.  She is s/p ORIF 12/01/2021    Subjective   Patient seen and examined, denies any pain.   Assessment/Plan:     Closed left displaced intertrochanteric hip fracture -S/p ORIF on 12/01/2021 -Pain control -PT/OT -Weightbearing as tolerated  Nausea -Improved with antiemetics  Leukocytosis -Unclear etiology, likely reactive -UA ordered, still pending  Hypertension -Blood pressure is  mildly elevated -Likely due to pain -Continue as needed IV hydralazine  Normocytic anemia -Hemoglobin dropped to 7.3 today -Likely dilutional effect; discontinue IV fluids -Follow CBC in a.m.       Medications     Chlorhexidine Gluconate Cloth  6 each Topical Daily   Chlorhexidine Gluconate Cloth  6 each Topical Q0600   docusate sodium  100 mg Oral BID   enoxaparin (LOVENOX) injection  40 mg Subcutaneous Q24H   mupirocin ointment  1 application Nasal BID     Data Reviewed:   CBG:  No results for input(s): GLUCAP in the last 168 hours.  SpO2: 100 %    Vitals:   12/02/21 0720 12/02/21 2006 12/03/21 0745 12/03/21 1438  BP: 119/66 138/62 (!) 160/82 (!) 149/59  Pulse: 75 77 90 78  Resp:  16 18 18   Temp: 98.3 F (36.8 C) 98.8 F (37.1 C) 99.3 F (37.4 C) 98.1 F (36.7 C)  TempSrc: Oral Oral Oral   SpO2:  99% 98% 100%  Weight:      Height:         Intake/Output Summary (Last 24 hours) at 12/03/2021 1727 Last data filed at 12/03/2021 1500 Gross per 24 hour  Intake 600 ml  Output --  Net 600 ml    12/26 1901 - 12/28 0700 In: 240 [P.O.:240] Out: -   Filed Weights   12/01/21 1543 12/01/21 1600  Weight: 47.6 kg  47.6 kg    Data Reviewed: Basic Metabolic Panel: Recent Labs  Lab 11/30/21 2053 12/01/21 0425 12/02/21 0425 12/03/21 0246  NA 133* 135 134* 133*  K 3.7 4.0 4.0 3.5  CL 105 105 106 106  CO2 18* 20* 20* 22  GLUCOSE 91 105* 124* 103*  BUN 18 16 13 12   CREATININE 0.86 0.82 0.99 0.87  CALCIUM 8.9 8.3* 8.2* 7.9*  MG  --   --  2.0 2.1   Liver Function Tests: Recent Labs  Lab 12/01/21 0425  AST 27  ALT 15  ALKPHOS 60  BILITOT 0.7  PROT 5.8*  ALBUMIN 3.2*   No results for input(s): LIPASE, AMYLASE in the last 168 hours. No results for input(s): AMMONIA in the last 168 hours. CBC: Recent Labs  Lab 11/30/21 2053 12/01/21 0425 12/02/21 0425 12/03/21 0246  WBC 16.8* 11.9* 11.6* 13.1*  NEUTROABS 11.7*  --   --   --   HGB 9.5* 8.7* 8.2* 7.3*  HCT 29.3* 27.4* 25.3* 21.6*  MCV 88.0 88.4 87.2 87.4  PLT 402* 344 296 279   Cardiac Enzymes: No results for input(s): CKTOTAL, CKMB, CKMBINDEX, TROPONINI in the last 168 hours. BNP (last 3 results) No results for input(s): BNP in the last 8760 hours.  ProBNP (last 3 results) No results for input(s): PROBNP in the  last 8760 hours.  CBG: No results for input(s): GLUCAP in the last 168 hours.     Radiology Reports  Pelvis Portable  Result Date: 12/01/2021 CLINICAL DATA:  Postop EXAM: PORTABLE PELVIS 1-2 VIEWS COMPARISON:  11/30/2021 FINDINGS: Changes of internal fixation across the left femoral intertrochanteric fracture. Near anatomic alignment. Minimal residual displacement of the lesser trochanter. No hardware or bony complicating feature. IMPRESSION: Internal fixation.  No visible complicating feature. Electronically Signed   By: Rolm Baptise M.D.   On: 12/01/2021 18:08   DG C-Arm 1-60 Min-No Report  Result Date: 12/01/2021 Fluoroscopy was utilized by the requesting physician.  No radiographic interpretation.   DG FEMUR MIN 2 VIEWS LEFT  Result Date: 12/01/2021 CLINICAL DATA:  IM nail, internal fixation EXAM: LEFT  FEMUR 2 VIEWS COMPARISON:  11/30/2021 FINDINGS: Multiple intraoperative spot images demonstrate internal fixation across the left femoral intertrochanteric fracture. No hardware complicating feature. Near anatomic alignment. IMPRESSION: Internal fixation.  No visible complicating feature. Electronically Signed   By: Rolm Baptise M.D.   On: 12/01/2021 18:08       Antibiotics: Anti-infectives (From admission, onward)    Start     Dose/Rate Route Frequency Ordered Stop   12/02/21 0600  ceFAZolin (ANCEF) IVPB 2g/100 mL premix        2 g 200 mL/hr over 30 Minutes Intravenous On call to O.R. 12/01/21 1500 12/01/21 1650   12/02/21 0100  ceFAZolin (ANCEF) IVPB 2g/100 mL premix        2 g 200 mL/hr over 30 Minutes Intravenous Every 8 hours 12/01/21 1945 12/02/21 1655         DVT prophylaxis: Lovenox 40 mg subcu daily for 2 weeks as per orthopedics  Code Status: Full code  Family Communication: No family at bedside   Consultants: Orthopedic surgery  Procedures: ORIF for left displaced intertrochanteric hip fracture    Objective    Physical Examination:   General-appears in no acute distress Heart-S1-S2, regular, no murmur auscultated Lungs-clear to auscultation bilaterally, no wheezing or crackles auscultated Abdomen-soft, nontender, no organomegaly Extremities-no edema in the lower extremities Neuro-alert, oriented x3, no focal deficit noted  Status is: Inpatient  Dispo: The patient is from: Home              Anticipated d/c is to: Home versus skilled nursing facility              Anticipated d/c date is: 12/05/2021              Patient currently not stable for discharge  Barrier to discharge-pending PT recommendation  COVID-19 Labs  No results for input(s): DDIMER, FERRITIN, LDH, CRP in the last 72 hours.  Lab Results  Component Value Date   [place] NEGATIVE 11/30/2021            Recent Results (from the past 240 hour(s))  Resp Panel by RT-PCR  (Flu A&B, Covid) Nasopharyngeal Swab     Status: None   Collection Time: 11/30/21 10:39 PM   Specimen: Nasopharyngeal Swab; Nasopharyngeal(NP) swabs in vial transport medium  Result Value Ref Range Status   SARS Coronavirus 2 by RT PCR NEGATIVE NEGATIVE Final    Comment: (NOTE) SARS-CoV-2 target nucleic acids are NOT DETECTED.  The SARS-CoV-2 RNA is generally detectable in upper respiratory specimens during the acute phase of infection. The lowest concentration of SARS-CoV-2 viral copies this assay can detect is 138 copies/mL. A negative result does not preclude SARS-Cov-2 infection and should not be used as the  sole basis for treatment or other patient management decisions. A negative result may occur with  improper specimen collection/handling, submission of specimen other than nasopharyngeal swab, presence of viral mutation(s) within the areas targeted by this assay, and inadequate number of viral copies(<138 copies/mL). A negative result must be combined with clinical observations, patient history, and epidemiological information. The expected result is Negative.  Fact Sheet for Patients:  EntrepreneurPulse.com.au  Fact Sheet for Healthcare Providers:  IncredibleEmployment.be  This test is no t yet approved or cleared by the [place] FDA and  has been authorized for detection and/or diagnosis of SARS-CoV-2 by FDA under an Emergency Use Authorization (EUA). This EUA will remain  in effect (meaning this test can be used) for the duration of the COVID-19 declaration under Section 564(b)(1) of the Act, 21 U.S.C.section 360bbb-3(b)(1), unless the authorization is terminated  or revoked sooner.       Influenza A by PCR NEGATIVE NEGATIVE Final   Influenza B by PCR NEGATIVE NEGATIVE Final    Comment: (NOTE) The Xpert Xpress SARS-CoV-2/FLU/RSV plus assay is intended as an aid in the diagnosis of influenza from Nasopharyngeal swab specimens  and should not be used as a sole basis for treatment. Nasal washings and aspirates are unacceptable for Xpert Xpress SARS-CoV-2/FLU/RSV testing.  Fact Sheet for Patients: EntrepreneurPulse.com.au  Fact Sheet for Healthcare Providers: IncredibleEmployment.be  This test is not yet approved or cleared by the [place] FDA and has been authorized for detection and/or diagnosis of SARS-CoV-2 by FDA under an Emergency Use Authorization (EUA). This EUA will remain in effect (meaning this test can be used) for the duration of the COVID-19 declaration under Section 564(b)(1) of the Act, 21 U.S.C. section 360bbb-3(b)(1), unless the authorization is terminated or revoked.  Performed at [facility], [place] [address], [place], [place] [address]   Surgical pcr screen     Status: Abnormal   Collection Time: 12/01/21  3:01 PM   Specimen: Nasal Mucosa; Nasal Swab  Result Value Ref Range Status   MRSA, PCR NEGATIVE NEGATIVE Final   Staphylococcus aureus POSITIVE (A) NEGATIVE Final    Comment: (NOTE) The Xpert SA Assay (FDA approved for NASAL specimens in patients 68 years of age and older), is one component of a comprehensive surveillance program. It is not intended to diagnose infection nor to guide or monitor treatment. Performed at [facility], [place] [address], [place],  [address]     [facility] If 7PM-7AM, please contact night-coverage at [email or web address], Office  [phone]   12/03/2021, 5:27 PM  LOS: 3 days

## 2021-12-03 NOTE — TOC CAGE-AID Note (Signed)
Transition of Care [facility]) - CAGE-AID Screening   Patient Details  Name: Marilyn Nicholson MRN: [id number] Date of Birth: 1946-09-19  Transition of [facility]) CM/SW Contact:    Katha Hamming, RN Phone [phone] 12/03/2021, 10:05 PM   Clinical Narrative:  Patient reports no alcohol or drug use, states hx of alcohol use but none currently. No need for resources at this tie.  CAGE-AID Screening:    Have You Ever Felt You Ought to Cut Down on Your Drinking or Drug Use?: No Have People Annoyed You By Critizing Your Drinking Or Drug Use?: No Have You Felt Bad Or Guilty About Your Drinking Or Drug Use?: No Have You Ever Had a Drink or Used Drugs First Thing In The Morning to Steady Your Nerves or to Get Rid of a Hangover?: No CAGE-AID Score: 0  Substance Abuse Education Offered: No

## 2021-12-03 NOTE — Care Management Important Message (Signed)
Important Message  Patient Details  Name: Marilyn Nicholson MRN: [id number] Date of Birth: Oct 03, 1946   Medicare Important Message Given:  Yes     Sherilyn Banker 12/03/2021, 11:25 AM

## 2021-12-04 LAB — CBC
HCT: 22.7 % — ABNORMAL LOW (ref 36.0–46.0)
Hemoglobin: 7.1 g/dL — ABNORMAL LOW (ref 12.0–15.0)
MCH: 28.7 pg (ref 26.0–34.0)
MCHC: 31.3 g/dL (ref 30.0–36.0)
MCV: 91.9 fL (ref 80.0–100.0)
Platelets: 167 10*3/uL (ref 150–400)
RBC: 2.47 MIL/uL — ABNORMAL LOW (ref 3.87–5.11)
RDW: 16.5 % — ABNORMAL HIGH (ref 11.5–15.5)
WBC: 11.3 10*3/uL — ABNORMAL HIGH (ref 4.0–10.5)
nRBC: 0 % (ref 0.0–0.2)

## 2021-12-04 LAB — PREPARE RBC (CROSSMATCH)

## 2021-12-04 LAB — BASIC METABOLIC PANEL
Anion gap: 8 (ref 5–15)
BUN: 12 mg/dL (ref 8–23)
CO2: 20 mmol/L — ABNORMAL LOW (ref 22–32)
Calcium: 7.7 mg/dL — ABNORMAL LOW (ref 8.9–10.3)
Chloride: 104 mmol/L (ref 98–111)
Creatinine, Ser: 0.77 mg/dL (ref 0.44–1.00)
GFR, Estimated: >60 mL/min (ref >60)
Glucose, Bld: 90 mg/dL (ref 70–99)
Potassium: 3.5 mmol/L (ref 3.5–5.1)
Sodium: 132 mmol/L — ABNORMAL LOW (ref 135–145)

## 2021-12-04 MED ORDER — SODIUM CHLORIDE 0.9% IV SOLUTION: Freq: Once | INTRAVENOUS | Status: AC

## 2021-12-04 NOTE — Progress Notes (Signed)
[facility]  PROGRESS NOTE  MERREDITH Nicholson [id number] DOB: Jun 08, 1946 DOA: 11/30/2021 PCP: Juluis Pitch, MD   Brief HPI:   75 year old female with history of hypertension, osteoporosis, brought to hospital after sustaining a fall while trying to get out of car   She was found to have left hip fracture.  Orthopedic surgery was consulted.  She is s/p ORIF 12/01/2021    Subjective   Patient seen and examined, feels fatigued today.  Hemoglobin down to 7.1.   Assessment/Plan:     Closed left displaced intertrochanteric hip fracture -S/p ORIF on 12/01/2021 -Pain control -PT/OT -Weightbearing as tolerated  Nausea -Improved with antiemetics  Leukocytosis -Unclear etiology, likely reactive -WBC is down to 11,000 today  Hypertension -Blood pressure is stable  Normocytic anemia -Hemoglobin dropped to 7.3 yesterday, IV fluids were discontinued -This morning hemoglobin was 7.1 -We will give 1 unit PRBC and follow CBC in a.m.  Hyponatremia, mild -Unclear etiology -Follow serum sodium in a.m.      Medications     sodium chloride   Intravenous Once   Chlorhexidine Gluconate Cloth  6 each Topical Daily   Chlorhexidine Gluconate Cloth  6 each Topical Q0600   docusate sodium  100 mg Oral BID   enoxaparin (LOVENOX) injection  40 mg Subcutaneous Q24H   mupirocin ointment  1 application Nasal BID     Data Reviewed:   CBG:  No results for input(s): GLUCAP in the last 168 hours.  SpO2: 99 %    Vitals:   12/03/21 0745 12/03/21 1438 12/03/21 2105 12/04/21 0815  BP: (!) 160/82 (!) 149/59 126/78 130/73  Pulse: 90 78 93 77  Resp: 18 18 18 17   Temp: 99.3 F (37.4 C) 98.1 F (36.7 C) 98.7 F (37.1 C) 98.2 F (36.8 C)  TempSrc: Oral  Oral Oral  SpO2: 98% 100% 100% 99%  Weight:      Height:         Intake/Output Summary (Last 24 hours) at 12/04/2021 1350 Last data filed at 12/04/2021 1211 Gross per 24 hour  Intake 480 ml  Output 4 ml  Net 476 ml     12/27 1901 - 12/29 0700 In: 600 [P.O.:600] Out: 3 [Urine:3]  Filed Weights   12/01/21 1543 12/01/21 1600  Weight: 47.6 kg 47.6 kg    Data Reviewed: Basic Metabolic Panel: Recent Labs  Lab 11/30/21 2053 12/01/21 0425 12/02/21 0425 12/03/21 0246 12/04/21 0314  NA 133* 135 134* 133* 132*  K 3.7 4.0 4.0 3.5 3.5  CL 105 105 106 106 104  CO2 18* 20* 20* 22 20*  GLUCOSE 91 105* 124* 103* 90  BUN 18 16 13 12 12   CREATININE 0.86 0.82 0.99 0.87 0.77  CALCIUM 8.9 8.3* 8.2* 7.9* 7.7*  MG  --   --  2.0 2.1  --    Liver Function Tests: Recent Labs  Lab 12/01/21 0425  AST 27  ALT 15  ALKPHOS 60  BILITOT 0.7  PROT 5.8*  ALBUMIN 3.2*   No results for input(s): LIPASE, AMYLASE in the last 168 hours. No results for input(s): AMMONIA in the last 168 hours. CBC: Recent Labs  Lab 11/30/21 2053 12/01/21 0425 12/02/21 0425 12/03/21 0246 12/04/21 0314  WBC 16.8* 11.9* 11.6* 13.1* 11.3*  NEUTROABS 11.7*  --   --   --   --   HGB 9.5* 8.7* 8.2* 7.3* 7.1*  HCT 29.3* 27.4* 25.3* 21.6* 22.7*  MCV 88.0 88.4 87.2 87.4 91.9  PLT 402*  344 296 279 167   Cardiac Enzymes: No results for input(s): CKTOTAL, CKMB, CKMBINDEX, TROPONINI in the last 168 hours. BNP (last 3 results) No results for input(s): BNP in the last 8760 hours.  ProBNP (last 3 results) No results for input(s): PROBNP in the last 8760 hours.  CBG: No results for input(s): GLUCAP in the last 168 hours.     Radiology Reports  No results found.     Antibiotics: Anti-infectives (From admission, onward)    Start     Dose/Rate Route Frequency Ordered Stop   12/02/21 0600  ceFAZolin (ANCEF) IVPB 2g/100 mL premix        2 g 200 mL/hr over 30 Minutes Intravenous On call to O.R. 12/01/21 1500 12/01/21 1650   12/02/21 0100  ceFAZolin (ANCEF) IVPB 2g/100 mL premix        2 g 200 mL/hr over 30 Minutes Intravenous Every 8 hours 12/01/21 1945 12/02/21 1655         DVT prophylaxis: Lovenox 40 mg subcu daily  for 2 weeks as per orthopedics  Code Status: Full code  Family Communication: No family at bedside   Consultants: Orthopedic surgery  Procedures: ORIF for left displaced intertrochanteric hip fracture    Objective    Physical Examination:   General-appears in no acute distress Heart-S1-S2, regular, no murmur auscultated Lungs-clear to auscultation bilaterally, no wheezing or crackles auscultated Abdomen-soft, nontender, no organomegaly Extremities-no edema in the lower extremities Neuro-alert, oriented x3, no focal deficit noted  Status is: Inpatient  Dispo: The patient is from: Home              Anticipated d/c is to: Home versus skilled nursing facility              Anticipated d/c date is: 12/05/2021              Patient currently not stable for discharge  Barrier to discharge-pending PT recommendation  COVID-19 Labs  No results for input(s): DDIMER, FERRITIN, LDH, CRP in the last 72 hours.  Lab Results  Component Value Date   [place] NEGATIVE 11/30/2021            Recent Results (from the past 240 hour(s))  Resp Panel by RT-PCR (Flu A&B, Covid) Nasopharyngeal Swab     Status: None   Collection Time: 11/30/21 10:39 PM   Specimen: Nasopharyngeal Swab; Nasopharyngeal(NP) swabs in vial transport medium  Result Value Ref Range Status   SARS Coronavirus 2 by RT PCR NEGATIVE NEGATIVE Final    Comment: (NOTE) SARS-CoV-2 target nucleic acids are NOT DETECTED.  The SARS-CoV-2 RNA is generally detectable in upper respiratory specimens during the acute phase of infection. The lowest concentration of SARS-CoV-2 viral copies this assay can detect is 138 copies/mL. A negative result does not preclude SARS-Cov-2 infection and should not be used as the sole basis for treatment or other patient management decisions. A negative result may occur with  improper specimen collection/handling, submission of specimen other than nasopharyngeal swab, presence of  viral mutation(s) within the areas targeted by this assay, and inadequate number of viral copies(<138 copies/mL). A negative result must be combined with clinical observations, patient history, and epidemiological information. The expected result is Negative.  Fact Sheet for Patients:  EntrepreneurPulse.com.au  Fact Sheet for Healthcare Providers:  IncredibleEmployment.be  This test is no t yet approved or cleared by the [place] FDA and  has been authorized for detection and/or diagnosis of SARS-CoV-2 by FDA under an Emergency Use Authorization (  EUA). This EUA will remain  in effect (meaning this test can be used) for the duration of the COVID-19 declaration under Section 564(b)(1) of the Act, 21 U.S.C.section 360bbb-3(b)(1), unless the authorization is terminated  or revoked sooner.       Influenza A by PCR NEGATIVE NEGATIVE Final   Influenza B by PCR NEGATIVE NEGATIVE Final    Comment: (NOTE) The Xpert Xpress SARS-CoV-2/FLU/RSV plus assay is intended as an aid in the diagnosis of influenza from Nasopharyngeal swab specimens and should not be used as a sole basis for treatment. Nasal washings and aspirates are unacceptable for Xpert Xpress SARS-CoV-2/FLU/RSV testing.  Fact Sheet for Patients: BloggerCourse.com  Fact Sheet for Healthcare Providers: SeriousBroker.it  This test is not yet approved or cleared by the [place] FDA and has been authorized for detection and/or diagnosis of SARS-CoV-2 by FDA under an Emergency Use Authorization (EUA). This EUA will remain in effect (meaning this test can be used) for the duration of the COVID-19 declaration under Section 564(b)(1) of the Act, 21 U.S.C. section 360bbb-3(b)(1), unless the authorization is terminated or revoked.  Performed at [facility], [address] [place], [place] [address]   Surgical pcr screen      Status: Abnormal   Collection Time: 12/01/21  3:01 PM   Specimen: Nasal Mucosa; Nasal Swab  Result Value Ref Range Status   MRSA, PCR NEGATIVE NEGATIVE Final   Staphylococcus aureus POSITIVE (A) NEGATIVE Final    Comment: (NOTE) The Xpert SA Assay (FDA approved for NASAL specimens in patients 70 years of age and older), is one component of a comprehensive surveillance program. It is not intended to diagnose infection nor to guide or monitor treatment. Performed at [facility], [address] [place], [place] [address]     Marilyn Nicholson   [facility] If 7PM-7AM, please contact night-coverage at [email or web address], Office  [phone]   12/04/2021, 1:50 PM  LOS: 4 days

## 2021-12-04 NOTE — Progress Notes (Signed)
Occupational Therapy Treatment Patient Details Name: Marilyn Nicholson MRN: [id number] DOB: [birth year] Today's Date: 12/04/2021   History of present illness Patient is a 75 y/o female who presents on 12/01/21 with left hip fx after a fall now s/p IM nail. PMH includes HTN.   OT comments  Pt making good progress with functional goals. Pt in recliner upon arrival. Session focused on sit - stand transitions, functional mobility using RW, toilet transfers, toileting task, grooming and UB ADLs, general ADL mobility safety trg. OT will continue to follow acutely to maximize level of function and safety   Recommendations for follow up therapy are one component of a multi-disciplinary discharge planning process, led by the attending physician.  Recommendations may be updated based on patient status, additional functional criteria and insurance authorization.    Follow Up Recommendations  Home health OT    Assistance Recommended at Discharge Frequent or constant Supervision/Assistance  Equipment Recommendations  BSC/3in1;Tub/shower seat;Other (comment) (RW)    Recommendations for Other Services      Precautions / Restrictions Precautions Precautions: Fall Restrictions Weight Bearing Restrictions: Yes LLE Weight Bearing: Non weight bearing       Mobility Bed Mobility Overal bed mobility: Needs Assistance Bed Mobility: Supine to Sit     Supine to sit: HOB elevated;Mod assist;Min assist     General bed mobility comments: pt in recliner on arrival    Transfers Overall transfer level: Needs assistance Equipment used: Rolling walker (2 wheels) Transfers: Sit to/from Stand Sit to Stand: Min assist           General transfer comment: Pt cues for hand placement     Balance Overall balance assessment: Needs assistance Sitting-balance support: Feet supported;No upper extremity supported Sitting balance-Leahy Scale: Fair     Standing balance support: Bilateral upper extremity  supported;During functional activity Standing balance-Leahy Scale: Poor Standing balance comment: Min guard for safety, UE support needed on RW                           ADL either performed or assessed with clinical judgement   ADL Overall ADL's : Needs assistance/impaired     Grooming: Wash/dry face;Wash/dry hands;Minimal assistance;Standing Grooming Details (indicate cue type and reason): min A for balance         Upper Body Dressing : Set up;Supervision/safety;Sitting   Lower Body Dressing: Maximal assistance Lower Body Dressing Details (indicate cue type and reason): donning socks Toilet Transfer: Minimal assistance;Rolling walker (2 wheels);Cueing for safety;BSC/3in1;Ambulation   Toileting- Clothing Manipulation and Hygiene: Moderate assistance;Sit to/from stand;Cueing for safety       Functional mobility during ADLs: Minimal assistance;Rolling walker (2 wheels);Cueing for safety;Cueing for sequencing      Extremity/Trunk Assessment Upper Extremity Assessment Upper Extremity Assessment: Generalized weakness   Lower Extremity Assessment Lower Extremity Assessment: Defer to PT evaluation   Cervical / Trunk Assessment Cervical / Trunk Assessment: Normal    Vision Baseline Vision/History: 1 Wears glasses Ability to See in Adequate Light: 0 Adequate Patient Visual Report: No change from baseline     Perception     Praxis      Cognition Arousal/Alertness: Awake/alert Behavior During Therapy: WFL for tasks assessed/performed Overall Cognitive Status: Within Functional Limits for tasks assessed  Exercises Exercises: General Lower Extremity General Exercises - Lower Extremity Ankle Circles/Pumps: AROM;Both;10 reps;Supine Quad Sets: AROM;Both;10 reps;Supine Long Arc Quad: Both;10 reps;Seated Heel Slides: Left;10 reps;Seated Hip ABduction/ADduction: Both;10 reps;Seated   Shoulder  Instructions       General Comments      Pertinent Vitals/ Pain       Pain Assessment: Faces Faces Pain Scale: Hurts little more Pain Location: L LE Pain Descriptors / Indicators: Sore;Operative site guarding;Grimacing;Guarding Pain Intervention(s): Monitored during session;Premedicated before session;Repositioned  Home Living                                          Prior Functioning/Environment              Frequency  Min 2X/week        Progress Toward Goals  OT Goals(current goals can now be found in the care plan section)  Progress towards OT goals: Progressing toward goals     Plan Discharge plan remains appropriate;Frequency remains appropriate    Co-evaluation                 AM-PAC OT "6 Clicks" Daily Activity     Outcome Measure   Help from another person eating meals?: None Help from another person taking care of personal grooming?: A Little Help from another person toileting, which includes using toliet, bedpan, or urinal?: A Lot Help from another person bathing (including washing, rinsing, drying)?: A Lot Help from another person to put on and taking off regular upper body clothing?: A Little Help from another person to put on and taking off regular lower body clothing?: A Lot 6 Click Score: 16    End of Session Equipment Utilized During Treatment: Gait belt;Rolling walker (2 wheels);Other (comment) (BSC)  OT Visit Diagnosis: Unsteadiness on feet (R26.81);Other abnormalities of gait and mobility (R26.89);History of falling (Z91.81);Muscle weakness (generalized) (M62.81);Pain Pain - Right/Left: Left Pain - part of body: Hip;Leg   Activity Tolerance Patient limited by fatigue;No increased pain   Patient Left in chair;with call bell/phone within reach;with chair alarm set   Nurse Communication          Time: [phone] OT Time Calculation (min): 21 min  Charges: OT General Charges $OT Visit: 1 Visit OT  Treatments $Self Care/Home Management : 8-22 mins    Galen Manila 12/04/2021, 2:33 PM

## 2021-12-04 NOTE — TOC Initial Note (Addendum)
Transition of Care [facility](TOC) - Initial/Assessment Note    Patient Details  Name: Marilyn ConstableJanice A Faerber MRN: [id number] Date of Birth: [birth year]  Transition of Care [facility](TOC) CM/SW Contact:    Harriet MassonKristie G Lanessa Shill, RN Phone Number: 12/04/2021, 2:06 PM  Clinical Narrative:          Spoke to patient regarding transition needs.Patient has a walker and doesn't want a 3&1 or tub bench. Orders for HH-PT/OT. Choice offered to patient. [facility]. Patient will be staying with her friend. Address [address] [place], [place] [address]     Patient has transportation home when discharged.   [facility] to [place] [facility] and they are unable to accept referral Spoke to [place] with [facility] and awaiting a response  [facility] a start date of 12/09/21  Expected Discharge Plan: Home w Home Health Services Barriers to Discharge: Continued Medical Work up   Patient Goals and CMS Choice Patient states their goals for this hospitalization and ongoing recovery are:: return home   Choice offered to / list presented to : Patient  Expected Discharge Plan and Services Expected Discharge Plan: Home w Home Health Services   Discharge Planning Services: CM Consult Post Acute Care Choice: Home Health Living arrangements for the past 2 months: Single Family Home                           HH Arranged: PT, OT [facility] Agency Contacted: 12/04/21 Time HH Agency Contacted: 1230 [facility] Agency: [facility]and[facility] [place]  Prior Living Arrangements/Services Living arrangements for the past 2 months: Single Family Home Lives with:: Significant Other Patient language and need for interpreter reviewed:: Yes Do you feel safe going back to the place where you live?: Yes      Need for Family Participation in Patient Care: Yes (Comment) Care giver support system in place?: Yes (comment) Current home services: DME (walker) Criminal Activity/Legal  Involvement Pertinent to Current Situation/Hospitalization: No - Comment as needed  Activities of Daily Living Home Assistive Devices/Equipment: None ADL Screening (condition at time of admission) Patient's cognitive ability adequate to safely complete daily activities?: Yes Is the patient deaf or have difficulty hearing?: No Does the patient have difficulty seeing, even when wearing glasses/contacts?: No Does the patient have difficulty concentrating, remembering, or making decisions?: No Patient able to express need for assistance with ADLs?: Yes Does the patient have difficulty dressing or bathing?: No Independently performs ADLs?: Yes (appropriate for developmental age) Does the patient have difficulty walking or climbing stairs?: Yes Weakness of Legs: Both Weakness of Arms/Hands: None  Permission Sought/Granted   Permission granted to share information with : Yes, Verbal Permission Granted     Permission granted to share info w AGENCY: home health        Emotional Assessment Appearance:: Appears stated age Attitude/Demeanor/Rapport: Engaged Affect (typically observed): Accepting Orientation: : Oriented to Self, Oriented to Place, Oriented to  Time, Oriented to Situation Alcohol / Substance Use: Not Applicable Psych Involvement: No (comment)  Admission diagnosis:  Surgery, elective [Z41.9] Closed displaced intertrochanteric fracture of left femur, initial encounter (HCC) [S72.142A] Displaced intertrochanteric fracture of left femur, initial encounter for closed fracture [facility](HCC) [S72.142A] Patient Active Problem List   Diagnosis Date Noted   Essential hypertension 11/30/2021   Displaced intertrochanteric fracture of left femur, initial encounter for closed fracture (HCC) 11/30/2021   Leucocytosis  11/30/2021   Anemia 11/30/2021   Hyperlipidemia 05/18/2018   Osteoporosis, 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

## 2021-12-04 NOTE — Plan of Care (Signed)
  Problem: Education: Goal: Knowledge of General Education information will improve Description: Including pain rating scale, medication(s)/side effects and non-pharmacologic comfort measures Outcome: Progressing   Problem: Health Behavior/Discharge Planning: Goal: Ability to manage health-related needs will improve Outcome: Progressing   Problem: Clinical Measurements: Goal: Ability to maintain clinical measurements within normal limits will improve Outcome: Progressing Goal: Will remain free from infection Outcome: Progressing Goal: Respiratory complications will improve Outcome: Progressing   Problem: Coping: Goal: Level of anxiety will decrease Outcome: Progressing   Problem: Pain Managment: Goal: General experience of comfort will improve Outcome: Progressing   

## 2021-12-04 NOTE — Progress Notes (Signed)
Mobility Specialist Progress Note   12/04/21 1700  Mobility  Activity Ambulated to bathroom  Level of Assistance Contact guard assist, steadying assist  Assistive Device Front wheel walker  LLE Weight Bearing WBAT  Distance Ambulated (ft) 16 ft (8x2)  Mobility Out of bed for toileting  Mobility Response Tolerated well  Mobility performed by Mobility specialist  $Mobility charge 1 Mobility   Received in bed w/ no symptoms requesting to use BR. Pt's gait seems unsteady d/t slight pain but able to ambulate to BR and back to bed w/ no complaint, successful void w/ flatus. Left with RN's in the room and call bell in reach.     Frederico Hamman Mobility Specialist Phone Number [phone]

## 2021-12-04 NOTE — Progress Notes (Signed)
Physical Therapy Treatment Patient Details Name: Marilyn Nicholson MRN: [id number] DOB: 06/22/1946 Today's Date: 12/04/2021   History of Present Illness Patient is a 75 y/o female who presents on 12/01/21 with left hip fx after a fall now s/p IM nail. PMH includes HTN.    PT Comments    Pt admitted with above diagnosis. Pt was able to ambulate with RW with min assist and cues. Pt needs cues for sequencing steps and RW. Pt did better with ambulation today  and is in less pain per pt report. Will continue to progress pt as able.  Pt currently with functional limitations due to balance and endurance deficits. Pt will benefit from skilled PT to increase their independence and safety with mobility to allow discharge to the venue listed below.      Recommendations for follow up therapy are one component of a multi-disciplinary discharge planning process, led by the attending physician.  Recommendations may be updated based on patient status, additional functional criteria and insurance authorization.  Follow Up Recommendations  Home health PT     Assistance Recommended at Discharge Frequent or constant Supervision/Assistance  Equipment Recommendations  Rolling walker (2 wheels);BSC/3in1    Recommendations for Other Services       Precautions / Restrictions Precautions Precautions: Fall Restrictions LLE Weight Bearing: Non weight bearing     Mobility  Bed Mobility Overal bed mobility: Needs Assistance Bed Mobility: Supine to Sit     Supine to sit: HOB elevated;Mod assist;Min assist     General bed mobility comments: Needed assist to initiate left LE movement and then pt needed less assist once movement initiated.    Transfers Overall transfer level: Needs assistance Equipment used: Rolling walker (2 wheels) Transfers: Sit to/from Stand Sit to Stand: Min assist           General transfer comment: Pt requiring placement of LLE anteriorly, cues for hand placement, slight assist  to power up    Ambulation/Gait Ambulation/Gait assistance: Min assist Gait Distance (Feet): 150 Feet Assistive device: Rolling walker (2 wheels) Gait Pattern/deviations: Step-to pattern;Decreased weight shift to left;Decreased stance time - left;Knee flexed in stance - left;Trunk flexed Gait velocity: decreased Gait velocity interpretation: <1.8 ft/sec, indicate of risk for recurrent falls   General Gait Details: Slowed, step to gait with left knee flexed in midstance and minimal weight left LE. Pt requiring min multimodal cues for sequencing, technique, progressive weightbearing through LLE. consistent assist for walker management with progressing forwards and turning   Stairs             Wheelchair Mobility    Modified Rankin (Stroke Patients Only)       Balance Overall balance assessment: Needs assistance Sitting-balance support: Feet supported;No upper extremity supported Sitting balance-Leahy Scale: Fair     Standing balance support: Bilateral upper extremity supported;During functional activity Standing balance-Leahy Scale: Poor Standing balance comment: Min guard for safety, UE support needed on RW                            Cognition Arousal/Alertness: Awake/alert Behavior During Therapy: WFL for tasks assessed/performed Overall Cognitive Status: Within Functional Limits for tasks assessed                                          Exercises General Exercises - Lower Extremity Ankle Circles/Pumps: AROM;Both;10  reps;Supine Quad Sets: AROM;Both;10 reps;Supine Long Arc Quad: Both;10 reps;Seated Heel Slides: Left;10 reps;Seated Hip ABduction/ADduction: Both;10 reps;Seated    General Comments        Pertinent Vitals/Pain Pain Assessment: Faces Faces Pain Scale: Hurts little more Pain Location: LLE with movement Pain Descriptors / Indicators: Sore;Operative site guarding;Grimacing;Guarding Pain Intervention(s): Limited  activity within patient's tolerance;Monitored during session;Repositioned    Home Living                          Prior Function            PT Goals (current goals can now be found in the care plan section) Acute Rehab PT Goals Patient Stated Goal: to get better and go home Progress towards PT goals: Progressing toward goals    Frequency    Min 5X/week      PT Plan Current plan remains appropriate    Co-evaluation              AM-PAC PT "6 Clicks" Mobility   Outcome Measure  Help needed turning from your back to your side while in a flat bed without using bedrails?: A Little Help needed moving from lying on your back to sitting on the side of a flat bed without using bedrails?: A Little Help needed moving to and from a bed to a chair (including a wheelchair)?: A Little Help needed standing up from a chair using your arms (e.g., wheelchair or bedside chair)?: A Lot Help needed to walk in hospital room?: A Little Help needed climbing 3-5 steps with a railing? : Total 6 Click Score: 15    End of Session Equipment Utilized During Treatment: Gait belt Activity Tolerance: Patient limited by fatigue Patient left: in chair;with call bell/phone within reach;with chair alarm set Nurse Communication: Mobility status PT Visit Diagnosis: Pain;Difficulty in walking, not elsewhere classified (R26.2) Pain - Right/Left: Left Pain - part of body: Leg     Time: 1009-1030 PT Time Calculation (min) (ACUTE ONLY): 21 min  Charges:  $Gait Training: 8-22 mins                     Jenni Thew M,PT Acute Rehab Services [phone] (pager)    Bevelyn Buckles 12/04/2021, 1:03 PM

## 2021-12-05 LAB — TYPE AND SCREEN
ABO/RH(D): A POS
Antibody Screen: NEGATIVE
Unit division: 0

## 2021-12-05 LAB — BPAM RBC
Blood Product Expiration Date: 202301172359
ISSUE DATE / TIME: 202212291716
Unit Type and Rh: 6200

## 2021-12-05 LAB — BASIC METABOLIC PANEL
Anion gap: 6 (ref 5–15)
BUN: 13 mg/dL (ref 8–23)
CO2: 21 mmol/L — ABNORMAL LOW (ref 22–32)
Calcium: 8.1 mg/dL — ABNORMAL LOW (ref 8.9–10.3)
Chloride: 106 mmol/L (ref 98–111)
Creatinine, Ser: 0.79 mg/dL (ref 0.44–1.00)
GFR, Estimated: >60 mL/min (ref >60)
Glucose, Bld: 106 mg/dL — ABNORMAL HIGH (ref 70–99)
Potassium: 3.6 mmol/L (ref 3.5–5.1)
Sodium: 133 mmol/L — ABNORMAL LOW (ref 135–145)

## 2021-12-05 LAB — HEMOGLOBIN AND HEMATOCRIT, BLOOD
HCT: 25.9 % — ABNORMAL LOW (ref 36.0–46.0)
Hemoglobin: 8.9 g/dL — ABNORMAL LOW (ref 12.0–15.0)

## 2021-12-05 NOTE — Discharge Summary (Signed)
Physician Discharge Summary  Marilyn Nicholson GXQ:[id number] DOB: [birth year] DOA: 11/30/2021  PCP: Marilyn Baseman, MD  Admit date: 11/30/2021 Discharge date: 12/05/2021  Time spent: 60 minutes  Recommendations for Outpatient Follow-up:  Follow-up orthopedic surgery in 2 weeks  Discharge Diagnoses:  Principal Problem:   Displaced intertrochanteric fracture of left femur, initial encounter for closed fracture [facility]) Active Problems:   Arthritis   Hyperlipidemia   Osteoporosis, post-menopausal   Essential hypertension   Leucocytosis   Anemia   Discharge Condition: Stable  Diet recommendation: Heart healthy diet  Filed Weights   12/01/21 1543 12/01/21 1600  Weight: 47.6 kg 47.6 kg    History of present illness:  75 year old female with history of hypertension, osteoporosis, brought to hospital after sustaining a fall while trying to get out of car   She was found to have left hip fracture.  Orthopedic surgery was consulted.  She is s/p ORIF 12/01/2021  Hospital Course:   Closed left displaced intertrochanteric hip fracture -S/p ORIF on 12/01/2021 -Pain control -PT/OT at home -Weightbearing as tolerated -DVT prophylaxis with Lovenox 40 mg subcu daily for 2 weeks as per orthopedic surgery's   Nausea -Improved with antiemetics   Leukocytosis -Unclear etiology, likely reactive -WBC is down to 11,000 today   Hypertension -Blood pressure is stable   Normocytic anemia/acute blood loss anemia -Hemoglobin dropped to 7.1 yesterday, IV fluids were discontinued -She received 1 unit PRBC -Today hemoglobin is 8.9   Hyponatremia, mild -Improved  Procedures: ORIF for left intertrochanteric hip fracture  Consultations: Orthopedics  Discharge Exam: Vitals:   12/05/21 0415 12/05/21 0815  BP: 137/83 (!) 151/92  Pulse: 77 73  Resp: 18 16  Temp: 97.9 F (36.6 C) 97.7 F (36.5 C)  SpO2: 99% 98%    General: Appears in no acute distress Cardiovascular: S1-S2,  regular, no murmur auscultated Respiratory: Clear to auscultation bilaterally  Discharge Instructions   Discharge Instructions     Diet - low sodium heart healthy   Complete by: As directed    Increase activity slowly   Complete by: As directed    No wound care   Complete by: As directed    Weight bearing as tolerated   Complete by: As directed       Allergies as of 12/05/2021       Reactions   Phenylpropanolamine Rash        Medication List     STOP taking these medications    ibuprofen 200 MG tablet Commonly known as: ADVIL       TAKE these medications    enoxaparin 40 MG/0.4ML injection Commonly known as: LOVENOX Inject 0.4 mLs (40 mg total) into the skin daily for 14 days.   oxyCODONE-acetaminophen 5-325 MG tablet Commonly known as: Percocet Take 1-2 tablets by mouth every 8 (eight) hours as needed for severe pain.   Oyster Shell Calcium/D 500-5 MG-MCG Tabs Take 1 tablet by mouth daily.   polyethylene glycol powder 17 GM/SCOOP powder Commonly known as: GLYCOLAX/MIRALAX Take 17 g by mouth daily as needed for mild constipation.   vitamin B-12 1000 MCG tablet Commonly known as: CYANOCOBALAMIN Take 1,000 mcg by mouth daily.               Discharge Care Instructions  (From admission, onward)           Start     Ordered   12/01/21 0000  Weight bearing as tolerated        12/01/21 1606  Allergies  Allergen Reactions   Phenylpropanolamine Rash    Follow-up Information     Leandrew Koyanagi, MD Follow up in 2 week(s).   Specialty: Orthopedic Surgery Why: For suture removal, For wound re-check Contact information: [place] [address] [place], [place] Follow up.   Why: HH-PT/OT arranged- They will contact you to schedule apt Tuesday 12/09/21 Contact information: [address] [place] [address] [phone]                  The results  of significant diagnostics from this hospitalization (including imaging, microbiology, ancillary and laboratory) are listed below for reference.    Significant Diagnostic Studies: Pelvis Portable  Result Date: 12/01/2021 CLINICAL DATA:  Postop EXAM: PORTABLE PELVIS 1-2 VIEWS COMPARISON:  11/30/2021 FINDINGS: Changes of internal fixation across the left femoral intertrochanteric fracture. Near anatomic alignment. Minimal residual displacement of the lesser trochanter. No hardware or bony complicating feature. IMPRESSION: Internal fixation.  No visible complicating feature. Electronically Signed   By: Rolm Baptise M.D.   On: 12/01/2021 18:08   DG C-Arm 1-60 Min-No Report  Result Date: 12/01/2021 Fluoroscopy was utilized by the requesting physician.  No radiographic interpretation.   DG Hip Unilat With Pelvis 2-3 Views Left  Result Date: 11/30/2021 CLINICAL DATA:  Fall and trauma to the left hip. EXAM: LEFT FEMUR 1 VIEW; DG HIP (WITH OR WITHOUT PELVIS) 2-3V LEFT COMPARISON:  Abdominal radiograph dated 11/12/2014. FINDINGS: There is a displaced and comminuted intertrochanteric fracture of the left femur. No dislocation. The bones are osteopenic. The soft tissues are unremarkable. Vascular calcifications noted. IMPRESSION: Displaced and comminuted intertrochanteric fracture of the left femur. Electronically Signed   By: Anner Crete M.D.   On: 11/30/2021 21:40   DG Femur 1V Left  Result Date: 11/30/2021 CLINICAL DATA:  Fall and trauma to the left hip. EXAM: LEFT FEMUR 1 VIEW; DG HIP (WITH OR WITHOUT PELVIS) 2-3V LEFT COMPARISON:  Abdominal radiograph dated 11/12/2014. FINDINGS: There is a displaced and comminuted intertrochanteric fracture of the left femur. No dislocation. The bones are osteopenic. The soft tissues are unremarkable. Vascular calcifications noted. IMPRESSION: Displaced and comminuted intertrochanteric fracture of the left femur. Electronically Signed   By: Anner Crete M.D.    On: 11/30/2021 21:40   DG FEMUR MIN 2 VIEWS LEFT  Result Date: 12/01/2021 CLINICAL DATA:  IM nail, internal fixation EXAM: LEFT FEMUR 2 VIEWS COMPARISON:  11/30/2021 FINDINGS: Multiple intraoperative spot images demonstrate internal fixation across the left femoral intertrochanteric fracture. No hardware complicating feature. Near anatomic alignment. IMPRESSION: Internal fixation.  No visible complicating feature. Electronically Signed   By: Rolm Baptise M.D.   On: 12/01/2021 18:08    Microbiology: Recent Results (from the past 240 hour(s))  Resp Panel by RT-PCR (Flu A&B, Covid) Nasopharyngeal Swab     Status: None   Collection Time: 11/30/21 10:39 PM   Specimen: Nasopharyngeal Swab; Nasopharyngeal(NP) swabs in vial transport medium  Result Value Ref Range Status   SARS Coronavirus 2 by RT PCR NEGATIVE NEGATIVE Final    Comment: (NOTE) SARS-CoV-2 target nucleic acids are NOT DETECTED.  The SARS-CoV-2 RNA is generally detectable in upper respiratory specimens during the acute phase of infection. The lowest concentration of SARS-CoV-2 viral copies this assay can detect is 138 copies/mL. A negative result does not preclude SARS-Cov-2 infection and should not be used as the sole basis for treatment or other  patient management decisions. A negative result may occur with  improper specimen collection/handling, submission of specimen other than nasopharyngeal swab, presence of viral mutation(s) within the areas targeted by this assay, and inadequate number of viral copies(<138 copies/mL). A negative result must be combined with clinical observations, patient history, and epidemiological information. The expected result is Negative.  Fact Sheet for Patients:  EntrepreneurPulse.com.au  Fact Sheet for Healthcare Providers:  IncredibleEmployment.be  This test is no t yet approved or cleared by the [place] FDA and  has been authorized for detection  and/or diagnosis of SARS-CoV-2 by FDA under an Emergency Use Authorization (EUA). This EUA will remain  in effect (meaning this test can be used) for the duration of the COVID-19 declaration under Section 564(b)(1) of the Act, 21 U.S.C.section 360bbb-3(b)(1), unless the authorization is terminated  or revoked sooner.       Influenza A by PCR NEGATIVE NEGATIVE Final   Influenza B by PCR NEGATIVE NEGATIVE Final    Comment: (NOTE) The Xpert Xpress SARS-CoV-2/FLU/RSV plus assay is intended as an aid in the diagnosis of influenza from Nasopharyngeal swab specimens and should not be used as a sole basis for treatment. Nasal washings and aspirates are unacceptable for Xpert Xpress SARS-CoV-2/FLU/RSV testing.  Fact Sheet for Patients: EntrepreneurPulse.com.au  Fact Sheet for Healthcare Providers: IncredibleEmployment.be  This test is not yet approved or cleared by the [place] FDA and has been authorized for detection and/or diagnosis of SARS-CoV-2 by FDA under an Emergency Use Authorization (EUA). This EUA will remain in effect (meaning this test can be used) for the duration of the COVID-19 declaration under Section 564(b)(1) of the Act, 21 U.S.C. section 360bbb-3(b)(1), unless the authorization is terminated or revoked.  Performed at [facility], [place] [address] [place], [place] [address]   Surgical pcr screen     Status: Abnormal   Collection Time: 12/01/21  3:01 PM   Specimen: Nasal Mucosa; Nasal Swab  Result Value Ref Range Status   MRSA, PCR NEGATIVE NEGATIVE Final   Staphylococcus aureus POSITIVE (A) NEGATIVE Final    Comment: (NOTE) The Xpert SA Assay (FDA approved for NASAL specimens in patients 89 years of age and older), is one component of a comprehensive surveillance program. It is not intended to diagnose infection nor to guide or monitor treatment. Performed at [facility], [place] [address] [place],  [place] [address]      Labs: Basic Metabolic Panel: Recent Labs  Lab 12/01/21 0425 12/02/21 0425 12/03/21 0246 12/04/21 0314 12/04/21 2345  NA 135 134* 133* 132* 133*  K 4.0 4.0 3.5 3.5 3.6  CL 105 106 106 104 106  CO2 20* 20* 22 20* 21*  GLUCOSE 105* 124* 103* 90 106*  BUN 16 13 12 12 13   CREATININE 0.82 0.99 0.87 0.77 0.79  CALCIUM 8.3* 8.2* 7.9* 7.7* 8.1*  MG  --  2.0 2.1  --   --    Liver Function Tests: Recent Labs  Lab 12/01/21 0425  AST 27  ALT 15  ALKPHOS 60  BILITOT 0.7  PROT 5.8*  ALBUMIN 3.2*    CBC: Recent Labs  Lab 11/30/21 2053 12/01/21 0425 12/02/21 0425 12/03/21 0246 12/04/21 0314 12/04/21 2345  WBC 16.8* 11.9* 11.6* 13.1* 11.3*  --   NEUTROABS 11.7*  --   --   --   --   --   HGB 9.5* 8.7* 8.2* 7.3* 7.1* 8.9*  HCT 29.3* 27.4* 25.3* 21.6* 22.7* 25.9*  MCV  88.0 88.4 87.2 87.4 91.9  --   PLT 402* 344 296 279 167  --         Signed:  Oswald Hillock MD.  [facility] 12/05/2021, 12:46 PM

## 2021-12-05 NOTE — TOC Transition Note (Signed)
Transition of Care (TOC) - CM/SW Discharge Note Donn Pierini RN, BSN Transitions of Care Unit 4E- RN Case Manager See Treatment Team for direct phone #  Cross Coverage for 5N  Patient Details  Name: Marilyn Nicholson MRN: [id number] Date of Birth: Dec 25, 1945  Transition of Care [facility]) CM/SW Contact:  Darrold Span, RN Phone Number: 12/05/2021, 1:54 PM   Clinical Narrative:    Pt stable for transition home today, per previous CM note- [facility] for start of care needs. No further TOC needs noted.    Final next level of care: Home w Home Health Services Barriers to Discharge: Barriers Resolved   Patient Goals and CMS Choice Patient states their goals for this hospitalization and ongoing recovery are:: return home CMS Medicare.gov Compare Post Acute Care list provided to:: Patient Choice offered to / list presented to : Patient  Discharge Placement               Home w/ [facility]        Discharge Plan and Services   Discharge Planning Services: CM Consult Post Acute Care Choice: Home Health          DME Arranged: N/A DME Agency: NA       HH Arranged: PT, OT HH Agency: [facility] Date HH Agency Contacted: 12/04/21 Time HH Agency Contacted: [phone] Representative spoke with at [facility] Agency: [facility]  Social Determinants of Health (SDOH) Interventions     Readmission Risk Interventions Readmission Risk Prevention Plan 12/05/2021  Post Dischage Appt Complete  Medication Screening Complete  Transportation Screening Complete  Some recent data might be hidden

## 2021-12-05 NOTE — Progress Notes (Signed)
Physical Therapy Treatment Patient Details Name: Marilyn Nicholson MRN: [id number] DOB: 01-31-1946 Today's Date: 12/05/2021   History of Present Illness Patient is a 75 y/o female who presents on 12/01/21 with left hip fx after a fall now s/p IM nail. PMH includes HTN.    PT Comments    Pt admitted with above diagnosis. Pt was able to ambulate with RW with incr distance and less cues and assist. Pt assisted with dressing as below as well as pt is going home. Will have assist at home.  Pt currently with functional limitations due to balance and endurance deficits. Pt will benefit from skilled PT to increase their independence and safety with mobility to allow discharge to the venue listed below.      Recommendations for follow up therapy are one component of a multi-disciplinary discharge planning process, led by the attending physician.  Recommendations may be updated based on patient status, additional functional criteria and insurance authorization.  Follow Up Recommendations  Home health PT     Assistance Recommended at Discharge Frequent or constant Supervision/Assistance  Equipment Recommendations  Rolling walker (2 wheels);BSC/3in1    Recommendations for Other Services       Precautions / Restrictions Precautions Precautions: Fall Restrictions LLE Weight Bearing: Weight bearing as tolerated     Mobility  Bed Mobility               General bed mobility comments: pt in recliner on arrival    Transfers Overall transfer level: Needs assistance Equipment used: Rolling walker (2 wheels) Transfers: Sit to/from Stand Sit to Stand: Supervision           General transfer comment: No issues with safety.  Pt asked PT if she could get dressed as she is going home therefore got pts clothes and she was able to get upper body clothes on without assist and needed min to mod assist for lower body dressing. Total ssist to put on shoes.    Ambulation/Gait Ambulation/Gait  assistance: Min guard;Supervision Gait Distance (Feet): 200 Feet Assistive device: Rolling walker (2 wheels) Gait Pattern/deviations: Step-to pattern;Decreased weight shift to left;Decreased stance time - left;Knee flexed in stance - left;Trunk flexed Gait velocity: decreased     General Gait Details: Slowed, step to gait with left knee flexed in midstance.  Requires  min multimodal cues for sequencing, technique, progressive weightbearing through LLE. Still needs occasional cues for walker safety as well but is progressing.   Stairs             Wheelchair Mobility    Modified Rankin (Stroke Patients Only)       Balance Overall balance assessment: Needs assistance Sitting-balance support: Feet supported;No upper extremity supported Sitting balance-Leahy Scale: Fair     Standing balance support: Bilateral upper extremity supported;During functional activity Standing balance-Leahy Scale: Poor Standing balance comment: Min guard for safety, UE support needed on RW                            Cognition Arousal/Alertness: Awake/alert Behavior During Therapy: WFL for tasks assessed/performed Overall Cognitive Status: Within Functional Limits for tasks assessed                                          Exercises General Exercises - Lower Extremity Ankle Circles/Pumps: AROM;Both;10 reps;Supine Quad Sets: AROM;Both;10 reps;Supine Long Arc  Quad: Both;10 reps;Seated Heel Slides: Left;10 reps;Seated Hip ABduction/ADduction: Both;10 reps;Seated    General Comments        Pertinent Vitals/Pain Pain Assessment: Faces Faces Pain Scale: Hurts little more Pain Location: L LE Pain Descriptors / Indicators: Sore;Operative site guarding;Grimacing;Guarding Pain Intervention(s): Limited activity within patient's tolerance;Monitored during session;Repositioned    Home Living                          Prior Function            PT Goals  (current goals can now be found in the care plan section) Acute Rehab PT Goals Patient Stated Goal: to get better and go home Progress towards PT goals: Progressing toward goals    Frequency    Min 5X/week      PT Plan Current plan remains appropriate    Co-evaluation              AM-PAC PT "6 Clicks" Mobility   Outcome Measure  Help needed turning from your back to your side while in a flat bed without using bedrails?: A Little Help needed moving from lying on your back to sitting on the side of a flat bed without using bedrails?: A Little Help needed moving to and from a bed to a chair (including a wheelchair)?: A Little Help needed standing up from a chair using your arms (e.g., wheelchair or bedside chair)?: A Little Help needed to walk in hospital room?: A Little Help needed climbing 3-5 steps with a railing? : Total 6 Click Score: 16    End of Session Equipment Utilized During Treatment: Gait belt Activity Tolerance: Patient limited by fatigue Patient left: in chair;with call bell/phone within reach;with chair alarm set Nurse Communication: Mobility status PT Visit Diagnosis: Pain;Difficulty in walking, not elsewhere classified (R26.2) Pain - Right/Left: Left Pain - part of body: Leg     Time: 1406-1430 PT Time Calculation (min) (ACUTE ONLY): 24 min  Charges:  $Gait Training: 8-22 mins $Therapeutic Exercise: 8-22 mins                     Rozann Holts M,PT Acute Rehab Services [phone] (pager)    Bevelyn Buckles 12/05/2021, 3:20 PM

## 2021-12-05 NOTE — Plan of Care (Signed)
  Problem: Education: Goal: Knowledge of General Education information will improve Description: Including pain rating scale, medication(s)/side effects and non-pharmacologic comfort measures Outcome: Progressing   Problem: Clinical Measurements: Goal: Cardiovascular complication will be avoided Outcome: Progressing   Problem: Nutrition: Goal: Adequate nutrition will be maintained Outcome: Progressing   Problem: Coping: Goal: Level of anxiety will decrease Outcome: Progressing   

## 2021-12-09 ENCOUNTER — Telehealth: Payer: Self-pay | Admitting: Orthopaedic Surgery

## 2021-12-09 NOTE — Telephone Encounter (Signed)
Coralee Rud from home health called and requesting orders 2 week 4 and 1 week 5.   CB [phone]

## 2021-12-09 NOTE — Telephone Encounter (Signed)
[place] for these orders? Patient just had surgery 12/01/2021.

## 2021-12-10 NOTE — Telephone Encounter (Signed)
[employer] and approved orders

## 2021-12-10 NOTE — Telephone Encounter (Signed)
yes

## 2021-12-16 ENCOUNTER — Encounter: Payer: Self-pay | Admitting: Orthopaedic Surgery

## 2021-12-16 ENCOUNTER — Ambulatory Visit (INDEPENDENT_AMBULATORY_CARE_PROVIDER_SITE_OTHER): Payer: Medicare Other | Admitting: Orthopaedic Surgery

## 2021-12-16 ENCOUNTER — Other Ambulatory Visit: Payer: Self-pay

## 2021-12-16 ENCOUNTER — Ambulatory Visit (INDEPENDENT_AMBULATORY_CARE_PROVIDER_SITE_OTHER): Payer: Medicare Other

## 2021-12-16 DIAGNOSIS — S72142A Displaced intertrochanteric fracture of left femur, initial encounter for closed fracture: Secondary | ICD-10-CM

## 2021-12-16 MED ORDER — HYDROCODONE-ACETAMINOPHEN 5-325 MG PO TABS: 1.0000 | ORAL_TABLET | Freq: Three times a day (TID) | ORAL | 0 refills | Status: DC | PRN

## 2021-12-16 NOTE — Progress Notes (Signed)
Post-Op Visit Note   Patient: Marilyn Nicholson           Date of Birth: 09/30/1946           MRN: [id number] Visit Date: 12/16/2021 PCP: [name], MD   Assessment & Plan:  Chief Complaint:  Chief Complaint  Patient presents with   Left Leg - Routine Post Op   Visit Diagnoses:  1. Displaced intertrochanteric fracture of left femur, initial encounter for closed fracture [facility])     Plan: Patient is a pleasant 76 year old female who comes in today 2 weeks status post left hip IM nail from an intertrochanteric femur fracture, date of surgery 12/01/2021.  She has been doing well in regards to her hip.  She has getting home health physical therapy where she is able to ambulate with a walker.  Of note, she was ambulating unassisted prior to the hip fracture.  Her main concern is pain and swelling to the left lower leg.  She has not been wearing compression socks.  She denies any actual calf pain, chest pain or shortness of breath.  She is on Lovenox for DVT prophylaxis and has 4 injections left.  No history of peripheral vascular disease.  Examination of the left lower extremity reveals well healed surgical incisions with staples intact.  No evidence of infection or cellulitis.  She does have marked swelling with blister formation to the entire tibia.  She has associated erythema.  This is all consistent with exacerbation of underlying venous stasis dermatitis.  No evidence of infection or cellulitis.  Calf is soft and nontender.  She does have good posterior tibial and dorsalis pedis pulses.  At this point, she is doing well from a hip perspective.  She may continue to bear weight as tolerated.  She will continue with home health physical therapy.  In regards to the venous stasis, we have applied Dynaflex wraps.  She will continue to elevate.  She will follow-up with [place] in 1 week's time for repeat evaluation and rewrap of the Dynaflex.  Call with concerns or questions in the meantime.  Follow-Up  Instructions: Return in about 1 week (around 12/23/2021).   Orders:  Orders Placed This Encounter  Procedures   XR FEMUR MIN 2 VIEWS LEFT   No orders of the defined types were placed in this encounter.   Imaging: No results found.  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   Arthritis 03/12/2014   Past Medical History:  Diagnosis Date   Hypertension     Family History  Problem Relation Age of Onset   Breast cancer Neg Hx     Past Surgical History:  Procedure Laterality Date   INTRAMEDULLARY (IM) NAIL INTERTROCHANTERIC Left 12/01/2021   Procedure: INTRAMEDULLARY (IM) NAIL INTERTROCHANTRIC;  Surgeon: Tarry Kos, MD;  Location: [facility] OR;  Service: Orthopedics;  Laterality: Left;   Social History   Occupational History   Not on file  Tobacco Use   Smoking status: Every Day    Packs/day: 0.75    Years: 43.00    Pack years: 32.25    Types: Cigarettes   Smokeless tobacco: Not on file  Substance and Sexual Activity   Alcohol use: Not Currently   Drug use: Never  Sexual activity: Not on file

## 2021-12-17 ENCOUNTER — Telehealth: Payer: Self-pay | Admitting: Orthopaedic Surgery

## 2021-12-17 ENCOUNTER — Other Ambulatory Visit: Payer: Self-pay | Admitting: Physician Assistant

## 2021-12-17 MED ORDER — HYDROCODONE-ACETAMINOPHEN 5-325 MG PO TABS: 1.0000 | ORAL_TABLET | Freq: Three times a day (TID) | ORAL | 0 refills | Status: AC | PRN

## 2021-12-17 NOTE — Telephone Encounter (Signed)
Corrected and resent

## 2021-12-17 NOTE — Telephone Encounter (Signed)
Pt has an appt yesterday and got a prescription refill of hydrocodone but it was sent to the wrong pharmacy. The new pharmacy to get that sent to is [id number] - [place], [place] - [address] and the best phone number is [phone].

## 2021-12-18 ENCOUNTER — Telehealth: Payer: Self-pay | Admitting: Orthopaedic Surgery

## 2021-12-18 NOTE — Telephone Encounter (Signed)
Patient needs her prescription for Oxycodone [facility] to [facility] in [place]. Please advise.  [address] [place], [place] [address]  [phone]

## 2021-12-18 NOTE — Telephone Encounter (Signed)
Rx sent to correct pharm 

## 2021-12-23 ENCOUNTER — Ambulatory Visit (INDEPENDENT_AMBULATORY_CARE_PROVIDER_SITE_OTHER): Payer: Medicare Other | Admitting: Orthopaedic Surgery

## 2021-12-23 ENCOUNTER — Other Ambulatory Visit: Payer: Self-pay

## 2021-12-23 ENCOUNTER — Ambulatory Visit: Payer: Medicare Other | Admitting: Orthopaedic Surgery

## 2021-12-23 DIAGNOSIS — S72142A Displaced intertrochanteric fracture of left femur, initial encounter for closed fracture: Secondary | ICD-10-CM

## 2021-12-23 NOTE — Progress Notes (Signed)
° °  Post-Op Visit Note   Patient: CHARLENE DETTER           Date of Birth: [birth year]           MRN: [id number] Visit Date: 12/23/2021 PCP: [name], MD   Assessment & Plan:  Chief Complaint:  Chief Complaint  Patient presents with   Left Leg - Follow-up   Visit Diagnoses:  1. Displaced intertrochanteric fracture of left femur, initial encounter for closed fracture [facility])     Plan: Patient is a pleasant 76 year old female comes in today for evaluation of the left lower leg.  She is status post left hip fine-needle 12/01/2021 but developed quite a bit of venous stasis with significant swelling and blister formation.  She was seen in our office last week where Dynaflex wrap was applied.  She has been elevating as well as working with physical therapy at home.  She notes that the pain has improved.  Examination of the left lower extremity reveals mild swelling throughout the lower leg, but she still has significant swelling to the foot in addition to above where the wrap was applied.  There are no open sores or blisters.  She is neurovascular intact distally.  At this point, the swelling has improved enough to apply 1 of Dr. Lajoyce Corners socks.  She will continue to elevate as needed.  She will continue to ambulate with physical therapy and will follow-up with [place] in 3 weeks time for repeat evaluation and x-rays of the left femur.  Call with concerns or questions in the meantime.  Follow-Up Instructions: Return in about 3 weeks (around 01/13/2022).   Orders:  No orders of the defined types were placed in this encounter.  No orders of the defined types were placed in this encounter.   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   Arthritis 03/12/2014   Past Medical History:  Diagnosis Date   Hypertension     Family History  Problem Relation Age of Onset   Breast cancer Neg Hx     Past Surgical History:  Procedure Laterality Date   INTRAMEDULLARY (IM) NAIL INTERTROCHANTERIC Left 12/01/2021   Procedure: INTRAMEDULLARY (IM) NAIL INTERTROCHANTRIC;  Surgeon: Tarry Kos, MD;  Location: [facility] OR;  Service: Orthopedics;  Laterality: Left;   Social History   Occupational History   Not on file  Tobacco Use   Smoking status: Every Day    Packs/day: 0.75    Years: 43.00    Pack years: 32.25    Types: Cigarettes   Smokeless tobacco: Not on file  Substance and Sexual Activity   Alcohol use: Not Currently   Drug use: Never   Sexual activity: Not on file

## 2022-01-16 ENCOUNTER — Other Ambulatory Visit: Payer: Self-pay

## 2022-01-16 ENCOUNTER — Ambulatory Visit (INDEPENDENT_AMBULATORY_CARE_PROVIDER_SITE_OTHER): Payer: Medicare Other

## 2022-01-16 ENCOUNTER — Ambulatory Visit (INDEPENDENT_AMBULATORY_CARE_PROVIDER_SITE_OTHER): Payer: Medicare Other | Admitting: Orthopaedic Surgery

## 2022-01-16 ENCOUNTER — Encounter: Payer: Self-pay | Admitting: Orthopaedic Surgery

## 2022-01-16 VITALS — Ht 59.0 in | Wt 105.0 lb

## 2022-01-16 DIAGNOSIS — S72142A Displaced intertrochanteric fracture of left femur, initial encounter for closed fracture: Secondary | ICD-10-CM

## 2022-01-16 NOTE — Progress Notes (Signed)
° °  Post-Op Visit Note   Patient: Marilyn Nicholson           Date of Birth: July 19, 1946           MRN: [id number] Visit Date: 01/16/2022 PCP: Juluis Pitch, MD   Assessment & Plan:  Chief Complaint:  Chief Complaint  Patient presents with   Left Hip - Follow-up    IM nailing 12/01/2021   Visit Diagnoses:  1. Displaced intertrochanteric fracture of left femur, initial encounter for closed fracture [facility])     Plan: Marilyn Nicholson is approximately 7 weeks status post left intertrochanteric hip fracture.  She is ambulating with a rolling walker.  She reports some soreness and discomfort in the upper thigh and groin region.  Overall the pain is manageable.  Not taking any pain medications.  Examination of the left hip shows fully healed surgical scars.  Range of motion of the hip is well-tolerated.  The x-rays demonstrate continued fracture consolidation.  There is no hardware complications.  There is no settling of the fracture.  Marilyn Nicholson can continue to engage in activity as tolerated.  She is doing physical therapy.  We will see her back in 6 weeks with two-view x-rays of the left hip.  Follow-Up Instructions: Return in about 6 weeks (around 02/27/2022).   Orders:  Orders Placed This Encounter  Procedures   XR FEMUR MIN 2 VIEWS LEFT   No orders of the defined types were placed in this encounter.   Imaging: XR FEMUR MIN 2 VIEWS LEFT  Result Date: 01/16/2022 Stable fixation of intertrochanteric fracture.  There is evidence of fracture healing with callus formation.   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   Arthritis 03/12/2014   Past Medical History:  Diagnosis Date   Hypertension     Family  History  Problem Relation Age of Onset   Breast cancer Neg Hx     Past Surgical History:  Procedure Laterality Date   INTRAMEDULLARY (IM) NAIL INTERTROCHANTERIC Left 12/01/2021   Procedure: INTRAMEDULLARY (IM) NAIL INTERTROCHANTRIC;  Surgeon: Leandrew Koyanagi, MD;  Location: [place];  Service: Orthopedics;  Laterality: Left;   Social History   Occupational History   Not on file  Tobacco Use   Smoking status: Every Day    Packs/day: 0.75    Years: 43.00    Pack years: 32.25    Types: Cigarettes   Smokeless tobacco: Not on file  Substance and Sexual Activity   Alcohol use: Not Currently   Drug use: Never   Sexual activity: Not on file

## 2022-02-04 ENCOUNTER — Telehealth: Payer: Self-pay

## 2022-02-04 NOTE — Telephone Encounter (Signed)
Called back no answer LMOM approving orders. ? ?

## 2022-02-04 NOTE — Telephone Encounter (Signed)
Coralee Rud , pt therapist, needs verbal ok for continued therapy.  ? ?Cb # [phone] ?

## 2022-02-17 ENCOUNTER — Telehealth: Payer: Self-pay | Admitting: *Deleted

## 2022-02-17 NOTE — Telephone Encounter (Signed)
LMTC to schedule Yearly Lung CA CT Scan. 

## 2022-02-20 ENCOUNTER — Telehealth: Payer: Self-pay | Admitting: Orthopaedic Surgery

## 2022-02-20 NOTE — Telephone Encounter (Signed)
Marilyn Nicholson is aware 

## 2022-02-20 NOTE — Telephone Encounter (Signed)
yes

## 2022-02-20 NOTE — Telephone Encounter (Signed)
Pt has not been doing therapy. As her caregiver (husband) is in the hospital and she has been staying with him  ? ? ?Can you call and give verbal orders that its ok to miss OC\PT this week, due to the above. ? ?Since missing therapy is going against provider orders  ?

## 2022-02-24 ENCOUNTER — Other Ambulatory Visit: Payer: Self-pay

## 2022-02-24 ENCOUNTER — Ambulatory Visit (INDEPENDENT_AMBULATORY_CARE_PROVIDER_SITE_OTHER): Payer: Medicare Other | Admitting: Orthopaedic Surgery

## 2022-02-24 ENCOUNTER — Ambulatory Visit (INDEPENDENT_AMBULATORY_CARE_PROVIDER_SITE_OTHER): Payer: Medicare Other

## 2022-02-24 ENCOUNTER — Encounter: Payer: Self-pay | Admitting: Orthopaedic Surgery

## 2022-02-24 DIAGNOSIS — S72142A Displaced intertrochanteric fracture of left femur, initial encounter for closed fracture: Secondary | ICD-10-CM

## 2022-02-24 NOTE — Progress Notes (Signed)
? ?  Post-Op Visit Note ?  ?Patient: Marilyn Nicholson           ?Date of Birth: 11-18-1946           ?MRN: [id number] ?Visit Date: 02/24/2022 ?PCP: [name], MD ? ? ?Assessment & Plan: ? ?Chief Complaint:  ?Chief Complaint  ?Patient presents with  ? Left Hip - Pain  ? ?Visit Diagnoses:  ?1. Displaced intertrochanteric fracture of left femur, initial encounter for closed fracture (HCC)   ? ? ?Plan: Patient is a pleasant 76 year old female who comes in today approximately 3 months status post left hip IM nail from an intertrochanteric femur fracture, date of surgery 12/01/2021.  Overall, she is doing well.  She notes slight discomfort today but thinks she may have overdone things yesterday.  She is taking ibuprofen as needed.  Examination of the left hip reveals painless hip flexion and logroll.  She is neurovascular intact distally.  At this point, she will continue with her home exercise program.  Follow-up with [place] in 6 weeks time for repeat evaluation and x-rays of the left femur.  Call with concerns or questions. ? ?Follow-Up Instructions: Return in about 6 weeks (around 04/07/2022).  ? ?Orders:  ?Orders Placed This Encounter  ?Procedures  ? XR HIP UNILAT W OR W/O PELVIS 2-3 VIEWS LEFT  ? ?No orders of the defined types were placed in this encounter. ? ? ?Imaging: ?XR HIP UNILAT W OR W/O PELVIS 2-3 VIEWS LEFT ? ?Result Date: 02/24/2022 ?X-rays demonstrate continued consolidation of fracture site without hardware complication  ? ?PMFS History: ?Patient Active Problem List  ? Diagnosis Date Noted  ? Essential hypertension 11/30/2021  ? Displaced intertrochanteric fracture of left femur, initial encounter for closed fracture (HCC) 11/30/2021  ? Leucocytosis 11/30/2021  ? Anemia 11/30/2021  ? Hyperlipidemia 05/18/2018  ? 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 Arthritis 03/12/2014  ? ?Past Medical History:  ?Diagnosis Date  ? Hypertension   ?  ?Family History  ?Problem Relation Age of Onset  ? Breast cancer Neg Hx    ?  ?Past Surgical History:  ?Procedure Laterality Date  ? INTRAMEDULLARY (IM) NAIL INTERTROCHANTERIC Left 12/01/2021  ? Procedure: INTRAMEDULLARY (IM) NAIL INTERTROCHANTRIC;  Surgeon: Tarry Kos, MD;  Location: [facility] OR;  Service: Orthopedics;  Laterality: Left;  ? ?Social History  ? ?Occupational History  ? Not on file  ?Tobacco Use  ? Smoking status: Every Day  ?  Packs/day: 0.75  ?  Years: 43.00  ?  Pack years: 32.25  ?  Types: Cigarettes  ? Smokeless tobacco: Not on file  ?Substance and Sexual Activity  ? Alcohol use: Not Currently  ? Drug use: Never  ? Sexual activity: Not on file  ? ? ? ?

## 2022-04-07 ENCOUNTER — Encounter: Payer: Self-pay | Admitting: Orthopaedic Surgery

## 2022-04-07 ENCOUNTER — Ambulatory Visit (INDEPENDENT_AMBULATORY_CARE_PROVIDER_SITE_OTHER): Payer: Medicare Other

## 2022-04-07 ENCOUNTER — Ambulatory Visit: Payer: Medicare Other | Admitting: Orthopaedic Surgery

## 2022-04-07 DIAGNOSIS — S72142A Displaced intertrochanteric fracture of left femur, initial encounter for closed fracture: Secondary | ICD-10-CM

## 2022-04-07 NOTE — Progress Notes (Signed)
? ?  Post-Op Visit Note ?  ?Patient: Marilyn Nicholson           ?Date of Birth: 06-Jan-1946           ?MRN: [id number] ?Visit Date: 04/07/2022 ?PCP: [name], MD ? ? ?Assessment & Plan: ? ?Chief Complaint:  ?Chief Complaint  ?Patient presents with  ? Left Leg - Fracture  ? ?Visit Diagnoses:  ?1. Displaced intertrochanteric fracture of left femur, initial encounter for closed fracture (HCC)   ? ? ?Plan: Patient is a pleasant 76 year old female who comes in today little over 4 months out left hip IM nail from an intertrochanteric femur fracture, date of surgery 12/01/2021.  She has been doing well.  No complaints.  She has regained range of motion and strength.  Examination left hip reveals full hip flexion.  Painless logroll.  She is neurovascular intact distally.  At this point, she is clinically and radiographically doing well.  She will advance with activity as tolerated.  Follow-up with [place] as needed. ? ?Follow-Up Instructions: Return if symptoms worsen or fail to improve.  ? ?Orders:  ?Orders Placed This Encounter  ?Procedures  ? XR FEMUR MIN 2 VIEWS LEFT  ? ?No orders of the defined types were placed in this encounter. ? ? ?Imaging: ?No results found. ? ?PMFS History: ?Patient Active Problem List  ? Diagnosis Date Noted  ? Essential hypertension 11/30/2021  ? Displaced intertrochanteric fracture of left femur, initial encounter for closed fracture (HCC) 11/30/2021  ? Leucocytosis 11/30/2021  ? Anemia 11/30/2021  ? Hyperlipidemia 05/18/2018  ? 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 Arthritis 03/12/2014  ? ?Past Medical History:  ?Diagnosis Date  ? Hypertension   ?  ?Family History  ?Problem Relation Age of Onset  ? Breast cancer Neg Hx   ?  ?Past Surgical History:  ?Procedure Laterality Date  ? INTRAMEDULLARY (IM) NAIL INTERTROCHANTERIC Left 12/01/2021  ? Procedure: INTRAMEDULLARY (IM) NAIL INTERTROCHANTRIC;  Surgeon: Tarry Kos, MD;  Location: [facility] OR;  Service: Orthopedics;  Laterality: Left;  ? ?Social  History  ? ?Occupational History  ? Not on file  ?Tobacco Use  ? Smoking status: Every Day  ?  Packs/day: 0.75  ?  Years: 43.00  ?  Pack years: 32.25  ?  Types: Cigarettes  ? Smokeless tobacco: Not on file  ?Substance and Sexual Activity  ? Alcohol use: Not Currently  ? Drug use: Never  ? Sexual activity: Not on file  ? ? ? ?
